# Patient Record
Sex: Female | Born: 1975 | Race: White | Hispanic: No | Smoking: Never smoker
Health system: Southern US, Community
[De-identification: ages and names within clinical notes are randomized; demographics above are authoritative.]

## PROBLEM LIST (undated history)

## (undated) DIAGNOSIS — I10 Essential (primary) hypertension: Secondary | ICD-10-CM

## (undated) DIAGNOSIS — I739 Peripheral vascular disease, unspecified: Secondary | ICD-10-CM

## (undated) HISTORY — DX: Peripheral vascular disease, unspecified: I73.9

---

## 1999-04-05 HISTORY — PX: WISDOM TOOTH EXTRACTION: SHX21

## 2000-01-19 HISTORY — PX: OTHER SURGICAL HISTORY: SHX169

## 2005-04-04 HISTORY — PX: LASER ABLATION: SHX1947

## 2005-09-29 DIAGNOSIS — N946 Dysmenorrhea, unspecified: Secondary | ICD-10-CM

## 2005-09-29 HISTORY — DX: Dysmenorrhea, unspecified: N94.6

## 2007-09-03 DIAGNOSIS — R5383 Other fatigue: Secondary | ICD-10-CM

## 2007-09-03 HISTORY — DX: Other fatigue: R53.83

## 2011-09-12 DIAGNOSIS — F32A Depression, unspecified: Secondary | ICD-10-CM | POA: Insufficient documentation

## 2011-09-12 DIAGNOSIS — M25562 Pain in left knee: Secondary | ICD-10-CM | POA: Insufficient documentation

## 2013-07-01 DIAGNOSIS — I82409 Acute embolism and thrombosis of unspecified deep veins of unspecified lower extremity: Secondary | ICD-10-CM

## 2013-07-01 HISTORY — DX: Acute embolism and thrombosis of unspecified deep veins of unspecified lower extremity: I82.409

## 2013-07-05 DIAGNOSIS — Z7901 Long term (current) use of anticoagulants: Secondary | ICD-10-CM

## 2013-07-05 HISTORY — DX: Long term (current) use of anticoagulants: Z79.01

## 2016-03-02 ENCOUNTER — Emergency Department (HOSPITAL_COMMUNITY)
Admission: EM | Admit: 2016-03-02 | Discharge: 2016-03-02 | Disposition: A | Discharge status: Home / Self Care | Payer: Medicaid - Out of State | Attending: Emergency Medicine | Admitting: Emergency Medicine

## 2016-03-02 ENCOUNTER — Encounter (HOSPITAL_COMMUNITY): Payer: Self-pay

## 2016-03-02 DIAGNOSIS — N390 Urinary tract infection, site not specified: Secondary | ICD-10-CM | POA: Diagnosis not present

## 2016-03-02 DIAGNOSIS — I10 Essential (primary) hypertension: Secondary | ICD-10-CM | POA: Diagnosis not present

## 2016-03-02 DIAGNOSIS — R102 Pelvic and perineal pain: Secondary | ICD-10-CM | POA: Diagnosis present

## 2016-03-02 HISTORY — DX: Essential (primary) hypertension: I10

## 2016-03-02 LAB — URINALYSIS, ROUTINE W REFLEX MICROSCOPIC
Bilirubin Urine: NEGATIVE
Glucose, UA: NEGATIVE mg/dL
Ketones, ur: NEGATIVE mg/dL
Nitrite: POSITIVE — AB
Protein, ur: 100 mg/dL — AB
Specific Gravity, Urine: 1.016 (ref 1.005–1.030)
pH: 5.5 (ref 5.0–8.0)

## 2016-03-02 LAB — URINE MICROSCOPIC-ADD ON

## 2016-03-02 LAB — POC URINE PREG, ED: PREG TEST UR: NEGATIVE

## 2016-03-02 MED ORDER — CEPHALEXIN 500 MG PO CAPS: 500.0000 mg | ORAL_CAPSULE | Freq: Two times a day (BID) | ORAL | 0 refills | Status: DC

## 2016-03-02 MED ORDER — PHENAZOPYRIDINE HCL 200 MG PO TABS: 200.0000 mg | ORAL_TABLET | Freq: Three times a day (TID) | ORAL | 0 refills | Status: DC

## 2016-03-02 MED ORDER — PHENAZOPYRIDINE HCL 100 MG PO TABS
95.0000 mg | ORAL_TABLET | Freq: Once | ORAL | Status: AC
  Administered 2016-03-02: 100 mg via ORAL
  Filled 2016-03-02: qty 1

## 2016-03-02 MED ORDER — NAPROXEN 500 MG PO TABS: 500.0000 mg | ORAL_TABLET | Freq: Two times a day (BID) | ORAL | 0 refills | Status: DC

## 2016-03-02 MED ORDER — NAPROXEN 250 MG PO TABS
500.0000 mg | ORAL_TABLET | Freq: Once | ORAL | Status: AC
  Administered 2016-03-02: 500 mg via ORAL
  Filled 2016-03-02: qty 2

## 2016-03-02 MED ORDER — CEPHALEXIN 250 MG PO CAPS
500.0000 mg | ORAL_CAPSULE | Freq: Once | ORAL | Status: AC
  Administered 2016-03-02: 500 mg via ORAL
  Filled 2016-03-02: qty 2

## 2016-03-02 NOTE — ED Notes (Signed)
Spoke with family member regarding waiting time. Test resulted from triage spoke with [facility] to possibly be seen by provider in that location.

## 2016-03-02 NOTE — ED Notes (Signed)
Currently waiting for a discharge from [facility]. Patient notified and will wait at this time.

## 2016-03-02 NOTE — ED Notes (Signed)
Pt stable, ambulatory, states understanding of discharge instructions 

## 2016-03-02 NOTE — ED Provider Notes (Signed)
[facility]-EMERGENCY DEPT Provider Note   CSN: [id number] Arrival date & time: 03/02/16  1519    History   Chief Complaint Chief Complaint  Patient presents with  . Pelvic Pain    HPI Emily Roy is a 40 y.o. female.  40 year old female presents to the emergency department for 3 days of pelvic pain. Symptoms have been worsening. She describes a sharp sensation which she feels is located at her uterus and radiating through her cervix. She does report urinary urgency and frequency as well as a sensation of incomplete bladder emptying. She has had some burning dysuria at the end of each void. Patient started her menstrual cycle today. She has had no fever, nausea, or vomiting. No medications taken prior to arrival for symptoms. She has not been sexually active in 9 years. No concern for STDs. No history of abdominal surgeries.   The history is provided by the patient. No language interpreter was used.    Past Medical History:  Diagnosis Date  . Hypertension     There are no active problems to display for this patient.   History reviewed. No pertinent surgical history.  OB History    No data available       Home Medications    Prior to Admission medications   Not on File    Family History No family history on file.  Social History Social History  Substance Use Topics  . Smoking status: Never Smoker  . Smokeless tobacco: Never Used  . Alcohol use No     Allergies   Patient has no known allergies.   Review of Systems Review of Systems Ten systems reviewed and are negative for acute change, except as noted in the HPI.    Physical Exam Updated Vital Signs BP 168/89 (BP Location: Right Arm)   Pulse 106   Temp 98.2 F (36.8 C) (Oral)   Resp 20   SpO2 100%   Physical Exam  Constitutional: She is oriented to person, place, and time. She appears well-developed and well-nourished. No distress.  Nontoxic appearing and in no distress  HENT:  Head:  Normocephalic and atraumatic.  Eyes: Conjunctivae and EOM are normal. No scleral icterus.  Neck: Normal range of motion.  Pulmonary/Chest: Effort normal. No respiratory distress. She has no wheezes.  Respirations even and unlabored  Abdominal: Soft. She exhibits no distension. There is no tenderness. There is no guarding.  Soft, obese, nontender abdomen  Musculoskeletal: Normal range of motion.  Neurological: She is alert and oriented to person, place, and time. She exhibits normal muscle tone. Coordination normal.  Skin: Skin is warm and dry. No rash noted. She is not diaphoretic. No erythema. No pallor.  Psychiatric: She has a normal mood and affect. Her behavior is normal.  Nursing note and vitals reviewed.    ED Treatments / Results  Labs (all labs ordered are listed, but only abnormal results are displayed) Labs Reviewed  URINALYSIS, ROUTINE W REFLEX MICROSCOPIC (NOT AT [facility]) - Abnormal; Notable for the following:       Result Value   Color, Urine AMBER (*)    APPearance TURBID (*)    Hgb urine dipstick LARGE (*)    Protein, ur 100 (*)    Nitrite POSITIVE (*)    Leukocytes, UA LARGE (*)    All other components within normal limits  URINE MICROSCOPIC-ADD ON - Abnormal; Notable for the following:    Squamous Epithelial / LPF 0-5 (*)    Bacteria, UA MANY (*)  Casts HYALINE CASTS (*)    All other components within normal limits  URINE CULTURE  POC URINE PREG, ED    EKG  EKG Interpretation None       Radiology No results found.  Procedures Procedures (including critical care time)  Medications Ordered in ED Medications  phenazopyridine (PYRIDIUM) tablet 100 mg (not administered)  naproxen (NAPROSYN) tablet 500 mg (not administered)  cephALEXin (KEFLEX) capsule 500 mg (not administered)     Initial Impression / Assessment and Plan / ED Course  I have reviewed the triage vital signs and the nursing notes.  Pertinent labs & imaging results that were  available during my care of the patient were reviewed by me and considered in my medical decision making (see chart for details).  Clinical Course     Pt has been diagnosed with a UTI. Pt is afebrile, normotensive, and denies N/V. No abdominal TTP. Patient to be discharged home with antibiotics and instructions to follow up with PCP if symptoms persist. Return precautions discussed and provided. Patient discharged in stable condition with no unaddressed concerns.   Final Clinical Impressions(s) / ED Diagnoses   Final diagnoses:  Acute lower UTI    New Prescriptions New Prescriptions   CEPHALEXIN (KEFLEX) 500 MG CAPSULE    Take 1 capsule (500 mg total) by mouth 2 (two) times daily.   NAPROXEN (NAPROSYN) 500 MG TABLET    Take 1 tablet (500 mg total) by mouth 2 (two) times daily.   PHENAZOPYRIDINE (PYRIDIUM) 200 MG TABLET    Take 1 tablet (200 mg total) by mouth 3 (three) times daily.     Antony MaduraKelly Frederico Gerling, PA-C 03/02/16 2221    Margarita Grizzleanielle Ray, MD 03/07/16 [phone]

## 2016-03-02 NOTE — ED Notes (Addendum)
Patient stated wanting to leave for a second time. Explain talking with [facility] to be possibly seen in that location. States will wait to hear if she will be able to be seen.

## 2016-03-02 NOTE — Discharge Instructions (Signed)
Take Keflex as prescribed until finished. Take pyridium to improve bladder spasms and pain. Take Naproxen for persistent pain. Follow up with a primary care doctor.

## 2016-03-02 NOTE — ED Triage Notes (Signed)
Patient complains of pelvic pain and possible hematuria or vaginal bleeding for several days. States that she is 6 days late on period

## 2016-03-05 LAB — URINE CULTURE: Culture: >=100000 — AB

## 2016-03-06 ENCOUNTER — Telehealth: Payer: Self-pay

## 2016-03-06 NOTE — Telephone Encounter (Signed)
Post ED Visit - Positive Culture Follow-up  Culture report reviewed by antimicrobial stewardship pharmacist:  []  Enzo BiNathan Batchelder, Pharm.D. []  Celedonio MiyamotoJeremy Frens, Pharm.D., BCPS []  Garvin FilaMike Maccia, Pharm.D. []  Georgina PillionElizabeth Martin, Pharm.D., BCPS []  [place] CityMinh Pham, [address]Ph[address]D., BCPS, AAHIVP []  Estella HuskMichelle Turner, Pharm.D., BCPS, AAHIVP []  Cassie Stewart, Pharm.D. []  Sherle Poeob Vincent, [address]Ph[address] Rachel Rumbarger Pharm D Positive urine culture Treated with Cephalexin, organism sensitive to the same and no further patient follow-up is required at this time.  Jerry CarasCullom, Ensley Blas Burnett 03/06/2016, 8:58 AM

## 2017-03-16 ENCOUNTER — Ambulatory Visit: Payer: Medicaid - Out of State | Admitting: Family Medicine

## 2020-04-09 ENCOUNTER — Other Ambulatory Visit: Payer: No Typology Code available for payment source

## 2020-04-09 DIAGNOSIS — Z20822 Contact with and (suspected) exposure to covid-19: Secondary | ICD-10-CM

## 2020-04-09 NOTE — Addendum Note (Signed)
Addended by: Royston Sinner B on: 04/09/2020 09:54 AM   Modules accepted: Orders

## 2020-04-10 LAB — NOVEL CORONAVIRUS, NAA: SARS-CoV-2, NAA: NOT DETECTED

## 2020-04-10 LAB — SARS-COV-2, NAA 2 DAY TAT

## 2020-08-05 ENCOUNTER — Emergency Department (HOSPITAL_BASED_OUTPATIENT_CLINIC_OR_DEPARTMENT_OTHER): Payer: No Typology Code available for payment source

## 2020-08-05 ENCOUNTER — Encounter (HOSPITAL_BASED_OUTPATIENT_CLINIC_OR_DEPARTMENT_OTHER): Payer: Self-pay | Admitting: Emergency Medicine

## 2020-08-05 ENCOUNTER — Other Ambulatory Visit: Payer: Self-pay

## 2020-08-05 ENCOUNTER — Emergency Department (HOSPITAL_BASED_OUTPATIENT_CLINIC_OR_DEPARTMENT_OTHER)
Admission: EM | Admit: 2020-08-05 | Discharge: 2020-08-05 | Disposition: A | Discharge status: Home / Self Care | Payer: No Typology Code available for payment source | Attending: Emergency Medicine | Admitting: Emergency Medicine

## 2020-08-05 DIAGNOSIS — J40 Bronchitis, not specified as acute or chronic: Secondary | ICD-10-CM | POA: Diagnosis not present

## 2020-08-05 DIAGNOSIS — I10 Essential (primary) hypertension: Secondary | ICD-10-CM | POA: Insufficient documentation

## 2020-08-05 DIAGNOSIS — R059 Cough, unspecified: Secondary | ICD-10-CM | POA: Diagnosis present

## 2020-08-05 DIAGNOSIS — Z20822 Contact with and (suspected) exposure to covid-19: Secondary | ICD-10-CM | POA: Diagnosis not present

## 2020-08-05 LAB — RESP PANEL BY RT-PCR (FLU A&B, COVID) ARPGX2
Influenza A by PCR: NEGATIVE
Influenza B by PCR: NEGATIVE
SARS Coronavirus 2 by RT PCR: NEGATIVE

## 2020-08-05 MED ORDER — ALBUTEROL SULFATE HFA 108 (90 BASE) MCG/ACT IN AERS
2.0000 | INHALATION_SPRAY | Freq: Once | RESPIRATORY_TRACT | Status: AC
  Administered 2020-08-05: 2 via RESPIRATORY_TRACT
  Filled 2020-08-05: qty 6.7

## 2020-08-05 MED ORDER — AEROCHAMBER PLUS FLO-VU MEDIUM MISC
1.0000 | Freq: Once | Status: AC
  Administered 2020-08-05: 1

## 2020-08-05 MED ORDER — IPRATROPIUM-ALBUTEROL 0.5-2.5 (3) MG/3ML IN SOLN: 3.0000 mL | Freq: Once | RESPIRATORY_TRACT | Status: AC

## 2020-08-05 MED ORDER — AZITHROMYCIN 250 MG PO TABS: ORAL_TABLET | ORAL | 0 refills | Status: DC

## 2020-08-05 MED ORDER — PREDNISONE 20 MG PO TABS: ORAL_TABLET | ORAL | 0 refills | Status: DC

## 2020-08-05 MED ORDER — BENZONATATE 100 MG PO CAPS: 200.0000 mg | ORAL_CAPSULE | Freq: Three times a day (TID) | ORAL | 0 refills | Status: DC | PRN

## 2020-08-05 MED ORDER — IPRATROPIUM-ALBUTEROL 0.5-2.5 (3) MG/3ML IN SOLN
RESPIRATORY_TRACT | Status: AC
  Administered 2020-08-05: 3 mL via RESPIRATORY_TRACT
  Filled 2020-08-05: qty 3

## 2020-08-05 NOTE — ED Provider Notes (Signed)
[facility] EMERGENCY DEPT Provider Note   CSN: [id number] Arrival date & time: 08/05/20  1906     History Chief Complaint  Patient presents with  . Cough  . Shortness of Breath    Emily Roy is a 45 y.o. female.  HPI Patient reports has been sick for 4 days.  She reports she is had a harsh cough.  Her chest has felt tight and she is felt short of breath.  Cough has been productive of a brown tinge sputum. she also reports she has had fever intermittently fever is as high as 101.  Some nausea and gagging but no vomiting.  No abdominal pain.  No lower extremity swelling or calf pain.  Symptoms have had some associated nasal congestion and postnasal drainage.  No significant sore throat or generalized body aches.  Patient thought she may have had COVID exposure.  Patient denies history of asthma.  Patient does not smoke.    Past Medical History:  Diagnosis Date  . Hypertension     There are no problems to display for this patient.   History reviewed. No pertinent surgical history.   OB History   No obstetric history on file.     History reviewed. No pertinent family history.  Social History   Tobacco Use  . Smoking status: Never Smoker  . Smokeless tobacco: Never Used  Substance Use Topics  . Alcohol use: No    Home Medications Prior to Admission medications   Medication Sig Start Date End Date Taking? Authorizing Provider  azithromycin (ZITHROMAX Z-PAK) 250 MG tablet 2 Tablets on the first day.  Then 1 tablet daily for the next 4 days. 08/05/20  Yes Arby Barrette, MD  benzonatate (TESSALON PERLES) 100 MG capsule Take 2 capsules (200 mg total) by mouth 3 (three) times daily as needed for cough. Swallow capsule whole.  Do not chew or suck the capsule 08/05/20  Yes Mea Ozga, Lebron Conners, MD  predniSONE (DELTASONE) 20 MG tablet 2 tabs po daily x 3 days 08/05/20  Yes Marlan Steward, Lebron Conners, MD  cephALEXin (KEFLEX) 500 MG capsule Take 1 capsule (500 mg total) by  mouth 2 (two) times daily. 03/02/16   Antony Madura, PA-C  naproxen (NAPROSYN) 500 MG tablet Take 1 tablet (500 mg total) by mouth 2 (two) times daily. 03/02/16   Antony Madura, PA-C  phenazopyridine (PYRIDIUM) 200 MG tablet Take 1 tablet (200 mg total) by mouth 3 (three) times daily. 03/02/16   Antony Madura, PA-C    Allergies    Patient has no known allergies.  Review of Systems   Review of Systems 10 systems reviewed and negative except as per HPI Physical Exam Updated Vital Signs BP 135/76   Pulse 93   Temp 98.8 F (37.1 C) (Oral)   Resp (!) 22   Ht 5\' 8"  (1.727 m)   Wt 136.1 kg   LMP 07/28/2020   SpO2 100%   BMI 45.61 kg/m   Physical Exam Constitutional:      Comments: Alert and nontoxic.  Mild increased work of breathing.  HENT:     Mouth/Throat:     Mouth: Mucous membranes are moist.     Pharynx: Oropharynx is clear.     Comments: No erythema or exudate in the throat Eyes:     Extraocular Movements: Extraocular movements intact.     Conjunctiva/sclera: Conjunctivae normal.  Cardiovascular:     Rate and Rhythm: Normal rate and regular rhythm.  Pulmonary:     Comments: Patient has  wheeze diffusely throughout lung fields.  No gross rhonchi or crackle Abdominal:     General: There is no distension.     Palpations: Abdomen is soft.     Tenderness: There is no abdominal tenderness. There is no guarding.  Musculoskeletal:        General: No swelling or tenderness. Normal range of motion.     Right lower leg: No edema.     Left lower leg: No edema.  Skin:    General: Skin is warm and dry.  Neurological:     General: No focal deficit present.     Mental Status: She is oriented to person, place, and time.     Motor: No weakness.     Coordination: Coordination normal.  Psychiatric:        Mood and Affect: Mood normal.     ED Results / Procedures / Treatments   Labs (all labs ordered are listed, but only abnormal results are displayed) Labs Reviewed  RESP PANEL  BY RT-PCR (FLU A&B, COVID) [id number]    EKG None  Radiology DG Chest Port 1 View  Result Date: 08/05/2020 CLINICAL DATA:  Cough and fever EXAM: PORTABLE CHEST 1 VIEW COMPARISON:  None. FINDINGS: The heart size and mediastinal contours are within normal limits. Both lungs are clear. The visualized skeletal structures are unremarkable. IMPRESSION: No active disease. Electronically Signed   By: Jasmine Pang M.D.   On: 08/05/2020 20:15    Procedures Procedures   Medications Ordered in ED Medications  ipratropium-albuterol (DUONEB) 0.5-2.5 (3) MG/3ML nebulizer solution 3 mL (3 mLs Nebulization Given 08/05/20 1935)  albuterol (VENTOLIN HFA) 108 (90 Base) MCG/ACT inhaler 2 puff (2 puffs Inhalation Given 08/05/20 2116)  AeroChamber Plus Flo-Vu Medium MISC 1 each (1 each Other Given 08/05/20 2115)    ED Course  I have reviewed the triage vital signs and the nursing notes.  Pertinent labs & imaging results that were available during my care of the patient were reviewed by me and considered in my medical decision making (see chart for details).    MDM Rules/Calculators/A&P                          Patient presents with a cough that is productive of a brown-tinged sputum and fever up to 101.  Chest x-ray does not show any acute infiltrates.  COVID and influenza testing are negative.  With patient having 4 days of symptoms including cough with sputum and fever, will opt to treat for possible atypical pneumonia or complex case of bronchitis.  Patient does have wheezing on exam.  She denies history of asthma and is a non-smoker.  Will prescribe Z-Pak and 3 days of prednisone with an albuterol inhaler and Tessalon Perles.  Follow-up plan reviewed and a return precautions reviewed. Final Clinical Impression(s) / ED Diagnoses Final diagnoses:  Bronchitis    Rx / DC Orders ED Discharge Orders         Ordered    azithromycin (ZITHROMAX Z-PAK) 250 MG tablet        08/05/20 2108    benzonatate (TESSALON  PERLES) 100 MG capsule  3 times daily PRN        08/05/20 2108    predniSONE (DELTASONE) 20 MG tablet        08/05/20 2108           Arby Barrette, MD 08/05/20 2142

## 2020-08-05 NOTE — Discharge Instructions (Signed)
1.  Schedule follow-up appointment with a family doctor.  If you do not have a family doctor, use referral number your discharge instructions to find one. 2.  Start your Z-Pak and prednisone as prescribed.  Use the albuterol inhaler every 4 hours as needed for coughing and wheezing.  You may take a Tessalon Perle for coughing.  Swallow this whole, do not chew it or suck on it. 3.  Return to the emergency department if you develop worsening shortness of breath, chest pain or other concerning symptoms. 4.  Your COVID testing was negative and your chest x-ray did not show any area of pneumonia at this time.

## 2020-08-05 NOTE — ED Triage Notes (Signed)
  Patient comes in with cough and SOB that has been going on for a few days.  Patient states she has had a fever, nasal congestion, headache, and sore throat for 3 days. Tmax of 101.1 at home.  Sick contact at work, possible COVID exposure.   Has been taking tylenol and ibuprofen, with 800 mg of ibuprofen at 1400.  Pain 6/10, fullness in chest with inspiration.

## 2020-08-05 NOTE — ED Notes (Signed)
Pt educated on proper use of Aero chamber for MDI administration. Pt able to teach back to RT proper use and technique.

## 2021-09-21 DIAGNOSIS — R0602 Shortness of breath: Secondary | ICD-10-CM | POA: Diagnosis not present

## 2021-09-21 DIAGNOSIS — M7989 Other specified soft tissue disorders: Secondary | ICD-10-CM | POA: Diagnosis not present

## 2021-09-21 DIAGNOSIS — R600 Localized edema: Secondary | ICD-10-CM | POA: Diagnosis not present

## 2021-09-22 ENCOUNTER — Emergency Department (HOSPITAL_BASED_OUTPATIENT_CLINIC_OR_DEPARTMENT_OTHER)
Admission: EM | Admit: 2021-09-22 | Discharge: 2021-09-22 | Disposition: A | Discharge status: Home / Self Care | Payer: BC Managed Care – PPO | Attending: Emergency Medicine | Admitting: Emergency Medicine

## 2021-09-22 ENCOUNTER — Emergency Department (HOSPITAL_BASED_OUTPATIENT_CLINIC_OR_DEPARTMENT_OTHER): Payer: BC Managed Care – PPO | Admitting: Radiology

## 2021-09-22 ENCOUNTER — Other Ambulatory Visit: Payer: Self-pay

## 2021-09-22 ENCOUNTER — Other Ambulatory Visit (HOSPITAL_BASED_OUTPATIENT_CLINIC_OR_DEPARTMENT_OTHER): Payer: Self-pay

## 2021-09-22 DIAGNOSIS — R030 Elevated blood-pressure reading, without diagnosis of hypertension: Secondary | ICD-10-CM | POA: Diagnosis not present

## 2021-09-22 DIAGNOSIS — R600 Localized edema: Secondary | ICD-10-CM | POA: Diagnosis not present

## 2021-09-22 DIAGNOSIS — R140 Abdominal distension (gaseous): Secondary | ICD-10-CM | POA: Insufficient documentation

## 2021-09-22 DIAGNOSIS — I10 Essential (primary) hypertension: Secondary | ICD-10-CM | POA: Diagnosis not present

## 2021-09-22 DIAGNOSIS — R0602 Shortness of breath: Secondary | ICD-10-CM | POA: Diagnosis not present

## 2021-09-22 LAB — D-DIMER, QUANTITATIVE: D-Dimer, Quant: 0.3 ug/mL-FEU (ref 0.00–0.50)

## 2021-09-22 LAB — CBC WITH DIFFERENTIAL/PLATELET
Abs Immature Granulocytes: 0.03 10*3/uL (ref 0.00–0.07)
Basophils Absolute: 0 10*3/uL (ref 0.0–0.1)
Basophils Relative: 1 %
Eosinophils Absolute: 0.2 10*3/uL (ref 0.0–0.5)
Eosinophils Relative: 3 %
HCT: 39.4 % (ref 36.0–46.0)
Hemoglobin: 12.4 g/dL (ref 12.0–15.0)
Immature Granulocytes: 0 %
Lymphocytes Relative: 26 %
Lymphs Abs: 1.8 10*3/uL (ref 0.7–4.0)
MCH: 25.3 pg — ABNORMAL LOW (ref 26.0–34.0)
MCHC: 31.5 g/dL (ref 30.0–36.0)
MCV: 80.4 fL (ref 80.0–100.0)
Monocytes Absolute: 0.4 10*3/uL (ref 0.1–1.0)
Monocytes Relative: 6 %
Neutro Abs: 4.5 10*3/uL (ref 1.7–7.7)
Neutrophils Relative %: 64 %
Platelets: 310 10*3/uL (ref 150–400)
RBC: 4.9 MIL/uL (ref 3.87–5.11)
RDW: 15.9 % — ABNORMAL HIGH (ref 11.5–15.5)
WBC: 7 10*3/uL (ref 4.0–10.5)
nRBC: 0 % (ref 0.0–0.2)

## 2021-09-22 LAB — BASIC METABOLIC PANEL
Anion gap: 11 (ref 5–15)
BUN: 10 mg/dL (ref 6–20)
CO2: 23 mmol/L (ref 22–32)
Calcium: 9.2 mg/dL (ref 8.9–10.3)
Chloride: 104 mmol/L (ref 98–111)
Creatinine, Ser: 0.77 mg/dL (ref 0.44–1.00)
GFR, Estimated: >60 mL/min (ref >60)
Glucose, Bld: 116 mg/dL — ABNORMAL HIGH (ref 70–99)
Potassium: 4.2 mmol/L (ref 3.5–5.1)
Sodium: 138 mmol/L (ref 135–145)

## 2021-09-22 LAB — TROPONIN I (HIGH SENSITIVITY): Troponin I (High Sensitivity): <2 ng/L (ref <18)

## 2021-09-22 MED ORDER — FUROSEMIDE 10 MG/ML IJ SOLN
40.0000 mg | Freq: Once | INTRAMUSCULAR | Status: AC
  Administered 2021-09-22: 40 mg via INTRAVENOUS
  Filled 2021-09-22: qty 4

## 2021-09-22 MED ORDER — FUROSEMIDE 20 MG PO TABS
20.0000 mg | ORAL_TABLET | Freq: Every day | ORAL | 0 refills | Status: DC
Start: 2021-09-22 — End: 2021-10-14
  Filled 2021-09-22: qty 10, 10d supply, fill #0

## 2021-09-22 NOTE — Discharge Instructions (Signed)
Please take furosemide daily for 10 days.  If for any reason you start to feel lightheaded, dizziness, or fatigue please stop taking medication.  There is always the chance that you lose the water weight and become dehydrated.  Please follow-up with your primary care physician or the wellness clinic provided for elevated blood pressure reading and further management of hypertension.  You will likely require blood pressure medications however we do not like to start medications unless you have to blood pressure readings that are elevated at separate times.

## 2021-09-22 NOTE — ED Triage Notes (Signed)
She reports recent "swelling everywhere" plus shortness of breath without chest pain. She was seen at an urgent care yesterday, where they performed cxr and ekg and she was told by them that she has "fluid around my heart". She Is ambulatory and in no distress. She was met by, and assessed by our R.T., Saralyn Pilar.

## 2021-09-22 NOTE — ED Notes (Signed)
RT Note: Pt. brought back from lobby after being notified here for s.o.b./roomed to #11, initially assessed b.s. which were clear/=b/l, RT to follow.

## 2021-09-22 NOTE — ED Provider Notes (Incomplete)
[facility] EMERGENCY DEPT Provider Note   CSN: [id number] Arrival date & time: 09/22/21  0630     History {Add pertinent medical, surgical, social history, OB history to HPI:1} Chief Complaint  Patient presents with   Shortness of Breath    Emily Roy is a 46 y.o. female.  Patient is a 46 year old female with no significant past medical history presenting for complaints of shortness of breath.  Patient admits to shortness of breath that is worse with exertion.  Occurs at rest.  Admits to wheezing.  Denies any fevers chills or coughing.  Denies any history of COPD, smoking, asthma.  Patient also admits to bilateral lower extremity swelling, upper extremity swelling, and abdominal bloating.  Dates she used to take furosemide for blood pressure and water retention however was told to stop several years ago due to normal blood pressures.  Current blood pressure 150/107.  Patient midst to history of headaches, blurred vision, and chest tightness intermittently.  None today.  The history is provided by the patient. No language interpreter was used.  Shortness of Breath Associated symptoms: no abdominal pain, no chest pain, no cough, no ear pain, no fever, no rash, no sore throat and no vomiting        Home Medications Prior to Admission medications   Medication Sig Start Date End Date Taking? Authorizing Provider  azithromycin (ZITHROMAX Z-PAK) 250 MG tablet 2 Tablets on the first day.  Then 1 tablet daily for the next 4 days. 08/05/20   Arby Barrette, MD  benzonatate (TESSALON PERLES) 100 MG capsule Take 2 capsules (200 mg total) by mouth 3 (three) times daily as needed for cough. Swallow capsule whole.  Do not chew or suck the capsule 08/05/20   Arby Barrette, MD  cephALEXin (KEFLEX) 500 MG capsule Take 1 capsule (500 mg total) by mouth 2 (two) times daily. 03/02/16   Antony Madura, PA-C  naproxen (NAPROSYN) 500 MG tablet Take 1 tablet (500 mg total) by mouth 2  (two) times daily. 03/02/16   Antony Madura, PA-C  phenazopyridine (PYRIDIUM) 200 MG tablet Take 1 tablet (200 mg total) by mouth 3 (three) times daily. 03/02/16   Antony Madura, PA-C  predniSONE (DELTASONE) 20 MG tablet 2 tabs po daily x 3 days 08/05/20   Arby Barrette, MD      Allergies    Patient has no known allergies.    Review of Systems   Review of Systems  Constitutional:  Negative for chills and fever.  HENT:  Negative for ear pain and sore throat.   Eyes:  Negative for pain and visual disturbance.  Respiratory:  Positive for shortness of breath. Negative for cough.   Cardiovascular:  Positive for leg swelling. Negative for chest pain and palpitations.  Gastrointestinal:  Negative for abdominal pain and vomiting.  Genitourinary:  Negative for dysuria and hematuria.  Musculoskeletal:  Negative for arthralgias and back pain.  Skin:  Negative for color change and rash.  Neurological:  Negative for seizures and syncope.  All other systems reviewed and are negative.   Physical Exam Updated Vital Signs BP (!) 150/101   Pulse 93   Temp 98.2 F (36.8 C) (Oral)   Resp 18   SpO2 94%  Physical Exam Vitals and nursing note reviewed.  Constitutional:      General: She is not in acute distress.    Appearance: She is well-developed.  HENT:     Head: Normocephalic and atraumatic.  Eyes:     Conjunctiva/sclera: Conjunctivae  normal.  Cardiovascular:     Rate and Rhythm: Normal rate and regular rhythm.     Heart sounds: No murmur heard. Pulmonary:     Effort: Pulmonary effort is normal. No respiratory distress.     Breath sounds: Normal breath sounds.  Abdominal:     Palpations: Abdomen is soft.     Tenderness: There is no abdominal tenderness.  Musculoskeletal:        General: No swelling.     Cervical back: Neck supple.     Right lower leg: 3+ Edema present.     Left lower leg: 3+ Edema present.  Skin:    General: Skin is warm and dry.     Capillary Refill: Capillary  refill takes less than 2 seconds.  Neurological:     Mental Status: She is alert.  Psychiatric:        Mood and Affect: Mood normal.     ED Results / Procedures / Treatments   Labs (all labs ordered are listed, but only abnormal results are displayed) Labs Reviewed  CBC WITH DIFFERENTIAL/PLATELET - Abnormal; Notable for the following components:      Result Value   MCH 25.3 (*)    RDW 15.9 (*)    All other components within normal limits  BASIC METABOLIC PANEL - Abnormal; Notable for the following components:   Glucose, Bld 116 (*)    All other components within normal limits  D-DIMER, QUANTITATIVE  TROPONIN I (HIGH SENSITIVITY)    EKG None  Radiology No results found.  Procedures Procedures  {Document cardiac monitor, telemetry assessment procedure when appropriate:1}  Medications Ordered in ED Medications  furosemide (LASIX) injection 40 mg (40 mg Intravenous Given 09/22/21 0843)    ED Course/ Medical Decision Making/ A&P                           Medical Decision Making Amount and/or Complexity of Data Reviewed Labs: ordered.  Risk Prescription drug management.   16:41 AM 46 year old female with no significant past medical history presenting for complaints of shortness of breath.  Patient is alert and oriented x3, no acute distress, afebrile, stable vital signs.  Physical exam demonstrates equal bilateral breath sounds with no adventitious lung sounds.  No wheezing.  Patient has swelling of bilateral lower extremities.  No pitting edema.  Blood pressure currently 150/101 while patient is resting.  Water retention visualized in upper and lower extremities.  Lasix 40 mg IV given.  ECG stable with no ST segment elevation or depression.  Normal sinus rhythm with a rate of 90 bpm.  No T wave inversions.  Normal axis.  Stable intervals.  Lites, troponin, chest x-ray within normal limits.   No signs of myocardial infarction.  No pneumothorax.  No pneumonia.  D-dimer  negative.  Low suspicion pulmonary embolism.  {Document critical care time when appropriate:1} {Document review of labs and clinical decision tools ie heart score, Chads2Vasc2 etc:1}  {Document your independent review of radiology images, and any outside records:1} {Document your discussion with family members, caretakers, and with consultants:1} {Document social determinants of health affecting pt's care:1} {Document your decision making why or why not admission, treatments were needed:1} Final Clinical Impression(s) / ED Diagnoses Final diagnoses:  None    Rx / DC Orders ED Discharge Orders     None

## 2021-09-23 ENCOUNTER — Other Ambulatory Visit: Payer: Self-pay

## 2021-09-23 ENCOUNTER — Emergency Department (HOSPITAL_COMMUNITY): Payer: BC Managed Care – PPO

## 2021-09-23 ENCOUNTER — Emergency Department (HOSPITAL_COMMUNITY)
Admission: EM | Admit: 2021-09-23 | Discharge: 2021-09-23 | Disposition: A | Discharge status: Home / Self Care | Payer: BC Managed Care – PPO | Attending: Emergency Medicine | Admitting: Emergency Medicine

## 2021-09-23 ENCOUNTER — Encounter (HOSPITAL_COMMUNITY): Payer: Self-pay | Admitting: *Deleted

## 2021-09-23 DIAGNOSIS — K8020 Calculus of gallbladder without cholecystitis without obstruction: Secondary | ICD-10-CM | POA: Diagnosis not present

## 2021-09-23 DIAGNOSIS — R0789 Other chest pain: Secondary | ICD-10-CM | POA: Diagnosis not present

## 2021-09-23 DIAGNOSIS — R531 Weakness: Secondary | ICD-10-CM | POA: Diagnosis not present

## 2021-09-23 DIAGNOSIS — Z79899 Other long term (current) drug therapy: Secondary | ICD-10-CM | POA: Insufficient documentation

## 2021-09-23 DIAGNOSIS — I10 Essential (primary) hypertension: Secondary | ICD-10-CM | POA: Diagnosis not present

## 2021-09-23 DIAGNOSIS — R519 Headache, unspecified: Secondary | ICD-10-CM | POA: Diagnosis not present

## 2021-09-23 DIAGNOSIS — R42 Dizziness and giddiness: Secondary | ICD-10-CM | POA: Diagnosis not present

## 2021-09-23 DIAGNOSIS — R002 Palpitations: Secondary | ICD-10-CM | POA: Diagnosis not present

## 2021-09-23 LAB — CBC
HCT: 39.9 % (ref 36.0–46.0)
Hemoglobin: 12.8 g/dL (ref 12.0–15.0)
MCH: 26 pg (ref 26.0–34.0)
MCHC: 32.1 g/dL (ref 30.0–36.0)
MCV: 81.1 fL (ref 80.0–100.0)
Platelets: 318 10*3/uL (ref 150–400)
RBC: 4.92 MIL/uL (ref 3.87–5.11)
RDW: 16.1 % — ABNORMAL HIGH (ref 11.5–15.5)
WBC: 9.6 10*3/uL (ref 4.0–10.5)
nRBC: 0 % (ref 0.0–0.2)

## 2021-09-23 LAB — BASIC METABOLIC PANEL
Anion gap: 8 (ref 5–15)
BUN: 17 mg/dL (ref 6–20)
CO2: 23 mmol/L (ref 22–32)
Calcium: 9.2 mg/dL (ref 8.9–10.3)
Chloride: 104 mmol/L (ref 98–111)
Creatinine, Ser: 0.76 mg/dL (ref 0.44–1.00)
GFR, Estimated: >60 mL/min (ref >60)
Glucose, Bld: 90 mg/dL (ref 70–99)
Potassium: 3.9 mmol/L (ref 3.5–5.1)
Sodium: 135 mmol/L (ref 135–145)

## 2021-09-23 LAB — TROPONIN I (HIGH SENSITIVITY)
Troponin I (High Sensitivity): <2 ng/L (ref <18)
Troponin I (High Sensitivity): <2 ng/L (ref <18)

## 2021-09-23 MED ORDER — IOHEXOL 350 MG/ML SOLN
100.0000 mL | Freq: Once | INTRAVENOUS | Status: AC | PRN
  Administered 2021-09-23: 100 mL via INTRAVENOUS

## 2021-09-23 MED ORDER — AMLODIPINE BESYLATE 5 MG PO TABS: 5.0000 mg | ORAL_TABLET | Freq: Every day | ORAL | 0 refills | Status: DC

## 2021-09-23 NOTE — ED Triage Notes (Addendum)
C/o weak, dizzy, HA, lateral neck throbbing, chest and bilateral jaw pressure. Onset at work 1100. Onset while walking and doing normal routine. Seen by corporate RN and instructed to go to ED. No aggravating or alleviating factors. Took ASA for HA this am at 0830. H/o similar, seen twice for same in last 2d, at [facility], and [facility]. Recent CXR, labs, and EKG reviewed. Verbalizes have been retaining fluid for ~ 2 weeks, lasix given yesterday.

## 2021-09-23 NOTE — ED Provider Notes (Incomplete)
[facility] EMERGENCY DEPARTMENT Provider Note   CSN: [id number] Arrival date & time: 09/23/21  1318     History {Add pertinent medical, surgical, social history, OB history to HPI:1} Chief Complaint  Patient presents with   Chest Pain    Emily Roy is a 46 y.o. female returning to the ED for continuation of chest pain with exertion.  At work today, none around 11 AM began having chest pain with "pressure in the neck" after exertion.  Went to get checked by a Research scientist (physical sciences) onsite 2 hours later, BP was about 185/120.  Encouraged to return to the ED.  Denies active chest pain, but felt pressure and shortness of breath when walking into the ED and getting to her room.  Rest usually relieves the symptoms, but now takes longer for symptoms to subside.  Hx of headaches, but these have increased in pain and duration as well.  Also with increasing leg swelling, especially with activity.  States her legs "tripled in size" causing her pants to rip.  Started Lasix this morning without much improvement.  No Hx of lung disorder such as asthma or COPD.  Denies N/V, recent upper respiratory infection, fevers, chills, cough, or tobacco use.  The history is provided by the patient and medical records.  Chest Pain      Home Medications Prior to Admission medications   Medication Sig Start Date End Date Taking? Authorizing Provider  furosemide (LASIX) 20 MG tablet Take 1 tablet (20 mg total) by mouth daily for 10 days. 09/22/21 10/02/21 Yes Edwin Dada P, DO  azithromycin (ZITHROMAX Z-PAK) 250 MG tablet 2 Tablets on the first day.  Then 1 tablet daily for the next 4 days. 08/05/20   Arby Barrette, MD  benzonatate (TESSALON PERLES) 100 MG capsule Take 2 capsules (200 mg total) by mouth 3 (three) times daily as needed for cough. Swallow capsule whole.  Do not chew or suck the capsule 08/05/20   Arby Barrette, MD  cephALEXin (KEFLEX) 500 MG capsule Take 1 capsule (500 mg total) by mouth 2 (two)  times daily. 03/02/16   Antony Madura, PA-C  naproxen (NAPROSYN) 500 MG tablet Take 1 tablet (500 mg total) by mouth 2 (two) times daily. 03/02/16   Antony Madura, PA-C  phenazopyridine (PYRIDIUM) 200 MG tablet Take 1 tablet (200 mg total) by mouth 3 (three) times daily. 03/02/16   Antony Madura, PA-C  predniSONE (DELTASONE) 20 MG tablet 2 tabs po daily x 3 days 08/05/20   Arby Barrette, MD      Allergies    Patient has no known allergies.    Review of Systems   Review of Systems  Cardiovascular:  Positive for chest pain.    Physical Exam Updated Vital Signs BP (!) 154/115   Pulse (!) 103   Temp 98.3 F (36.8 C) (Oral)   Resp (!) 22   Ht 5\' 7"  (1.702 m)   Wt (!) 152.9 kg   LMP 08/17/2021 (Approximate)   SpO2 96%   BMI 52.78 kg/m  Physical Exam Vitals and nursing note reviewed.  Constitutional:      General: She is not in acute distress.    Appearance: She is well-developed. She is not ill-appearing or diaphoretic.  HENT:     Head: Normocephalic and atraumatic.  Eyes:     Conjunctiva/sclera: Conjunctivae normal.  Neck:     Vascular: No JVD.  Cardiovascular:     Rate and Rhythm: Normal rate and regular rhythm.  Pulses:          Carotid pulses are 2+ on the right side and 2+ on the left side.      Radial pulses are 2+ on the right side and 2+ on the left side.       Dorsalis pedis pulses are 2+ on the right side and 2+ on the left side.     Heart sounds: Normal heart sounds. No murmur heard.    Comments: No carotid bruits appreciated. Pulmonary:     Effort: Pulmonary effort is normal. No tachypnea, accessory muscle usage or respiratory distress.     Breath sounds: Normal breath sounds. No decreased breath sounds or wheezing.     Comments: CTAB, able to communicate without difficulty, without increased respiratory effort Chest:     Chest wall: No tenderness.     Comments: Chest non-TTP  Abdominal:     Palpations: Abdomen is soft.     Tenderness: There is no  abdominal tenderness. There is no guarding or rebound.  Musculoskeletal:        General: No swelling.     Cervical back: Neck supple.     Right lower leg: Edema (Mild, non-pitting) present.     Left lower leg: Edema (Mild, non-pitting) present.     Comments: Without back or shoulder pain.  Skin:    General: Skin is warm and dry.     Capillary Refill: Capillary refill takes less than 2 seconds.  Neurological:     General: No focal deficit present.     Mental Status: She is alert and oriented to person, place, and time.  Psychiatric:        Mood and Affect: Mood normal.     ED Results / Procedures / Treatments   Labs (all labs ordered are listed, but only abnormal results are displayed) Labs Reviewed  CBC - Abnormal; Notable for the following components:      Result Value   RDW 16.1 (*)    All other components within normal limits  BASIC METABOLIC PANEL  TROPONIN I (HIGH SENSITIVITY)  TROPONIN I (HIGH SENSITIVITY)    EKG EKG Interpretation  Date/Time:  Thursday September 23 2021 13:34:06 EDT Ventricular Rate:  101 PR Interval:  170 QRS Duration: 74 QT Interval:  348 QTC Calculation: 451 R Axis:   97 Text Interpretation: Sinus tachycardia Rightward axis Low voltage QRS Borderline ECG When compared with ECG of 22-Sep-2021 07:58, No significant change since last tracing Confirmed by Meridee Score [phone]) on 09/23/2021 1:35:36 PM  Radiology CT Angio Chest PE W/Cm &/Or Wo Cm  Result Date: 09/23/2021 CLINICAL DATA:  Rule out pulmonary embolism. Complains of weakness, dizziness, headache, lateral neck throbbing, chest and bilateral jaw all pressure. EXAM: CT ANGIOGRAPHY CHEST WITH CONTRAST TECHNIQUE: Multidetector CT imaging of the chest was performed using the standard protocol during bolus administration of intravenous contrast. Multiplanar CT image reconstructions and MIPs were obtained to evaluate the vascular anatomy. RADIATION DOSE REDUCTION: This exam was performed according  to the departmental dose-optimization program which includes automated exposure control, adjustment of the mA and/or kV according to patient size and/or use of iterative reconstruction technique. CONTRAST:  OMNIPAQUE IOHEXOL 350 MG/ML SOLN COMPARISON:  None Available. FINDINGS: Cardiovascular: Satisfactory opacification of the pulmonary arteries to the segmental level. No evidence of pulmonary embolism. Mild cardiac enlargement.  No pericardial effusion. Mediastinum/Nodes: No enlarged mediastinal, hilar, or axillary lymph nodes. Thyroid gland, trachea, and esophagus demonstrate no significant findings. Lungs/Pleura: No pleural effusion. No  airspace consolidation, atelectasis or pneumothorax. Upper Abdomen: Subjective hepatic steatosis. There is hypertrophy of the lateral segment of left hepatic lobe as well as caudate lobe of liver. Gallstones are identified. Musculoskeletal: No chest wall abnormality. No acute or significant osseous findings. Review of the MIP images confirms the above findings. IMPRESSION: 1. No evidence for acute pulmonary embolism. 2. Subjective hepatic steatosis with hypertrophy of the lateral segment of left lobe of liver and caudate lobe of liver. These findings may be seen in the setting of early cirrhosis. 3. Gallstones. Electronically Signed   By: Signa Kell M.D.   On: 09/23/2021 18:34   CT Head Wo Contrast  Result Date: 09/23/2021 CLINICAL DATA:  Headache with increased frequency. Weakness and dizziness. EXAM: CT HEAD WITHOUT CONTRAST TECHNIQUE: Contiguous axial images were obtained from the base of the skull through the vertex without intravenous contrast. RADIATION DOSE REDUCTION: This exam was performed according to the departmental dose-optimization program which includes automated exposure control, adjustment of the mA and/or kV according to patient size and/or use of iterative reconstruction technique. COMPARISON:  None Available. FINDINGS: Brain: No evidence of acute  infarction, hemorrhage, hydrocephalus, extra-axial collection or mass lesion/mass effect. Vascular: Negative for hyperdense vessel Skull: Negative Sinuses/Orbits: Negative Other: None IMPRESSION: Normal CT head Electronically Signed   By: Marlan Palau M.D.   On: 09/23/2021 17:56   DG Chest 2 View  Result Date: 09/23/2021 CLINICAL DATA:  Chest pressure with weakness, dizziness, and palpitations. EXAM: CHEST - 2 VIEW COMPARISON:  Chest x-ray dated Aug 05, 2020. FINDINGS: The heart size and mediastinal contours are within normal limits. Both lungs are clear. The visualized skeletal structures are unremarkable. IMPRESSION: No active cardiopulmonary disease. Electronically Signed   By: Obie Dredge M.D.   On: 09/23/2021 16:02    Procedures Procedures  {Document cardiac monitor, telemetry assessment procedure when appropriate:1}  Medications Ordered in ED Medications  iohexol (OMNIPAQUE) 350 MG/ML injection 100 mL (100 mLs Intravenous Contrast Given 09/23/21 1811)    ED Course/ Medical Decision Making/ A&P                           Medical Decision Making Amount and/or Complexity of Data Reviewed External Data Reviewed: notes. Labs: ordered. Decision-making details documented in ED Course. Radiology: ordered and independent interpretation performed. Decision-making details documented in ED Course. ECG/medicine tests: ordered and independent interpretation performed. Decision-making details documented in ED Course.  Risk OTC drugs. Prescription drug management.   46 y.o. female presents to the ED for concern of Chest Pain   This involves an extensive number of treatment options, and is a complaint that carries with it a high risk of complications and morbidity.  The emergent differential diagnosis prior to evaluation includes, but is not limited to: ACS, GERD, dissection, pneumonia, pulmonary embolism, pneumothorax, costochondritis, anxiety  This is not an exhaustive differential.    Past Medical History / Co-morbidities / Social History: Hx of essential HTN, morbid obesity  Additional History:  Internal and external records from outside source obtained and reviewed including ED visits, PCP  Physical Exam: Physical exam performed. The pertinent findings include: Mild nonpitting edema bilateral extremities.  Lab Tests: I ordered, and personally interpreted labs.  The pertinent results include:   CBC: Unremarkable CMP/BMP: Unremarkable Troponin: Less than 2  Imaging Studies: I ordered imaging studies including CXR, CT head, and CTA chest.  I independently visualized and interpreted said imaging.  Pertinent results include: CXR: No active  cardiopulmonary disease CT head: Negative CTA chest:  1. Negative for PE 2. Subjective hepatic steatosis with hypertrophy of the lateral segment of left lobe of liver and caudate lobe of liver. May suggest early cirrhosis.  3. Gallstones I agree with the radiologist interpretation.  I personally ordered and interpreted EKG 12 lead findings, which showed: No significant change since last tracing  ED Course: Pt well-appearing on exam.  Third time back to ED in 3 days.  Complaining of continuing episodes of chest pain and pressure, especially with exertion.  Without active chest pain or dyspnea.  Also had an episode of symptomatic hypertension at work, with described increased pressure of the neck.  Without vision changes.  No neurodeficits or neurological complaints.  Low suspicion of CVA.  Without hypotension, muffled heart sounds, or JVD on exam.  EKG with low voltage, but when compared to multiple past EKGs, this is consistent.  Low suspicion for pericardial effusion.  No crackles or rales appreciated exam, CXR and troponins negative.  HEART score of 3.  Symptoms have since resolved, but remains tachycardic between 90-110 bpm with SPO2 99%.  Also complaining of increasing swelling of legs.  Nonpitting on exam and without calf  tenderness, erythema, or unilateral changes.  Low suspicion for DVT.  Placed order for CT head due to changes to and worsening of headaches, and CTA chest to assess for PE.  Upon reevaluation, patient remains unchanged.  Radiology negative for acute intracranial pathology or pulmonary embolism.  Low suspicion for ICH or SAH.  Patient in NAD in good condition at time of discharge.  Disposition: After consideration of the diagnostic results and the patient's encounter today, I feel that the emergency department workup does not suggest an emergent condition requiring admission or immediate intervention beyond what has been performed at this time.  The patient is safe for discharge and has been instructed to return immediately for worsening symptoms, change in symptoms or any other concerns.  I have reviewed the patients home medicines and have made adjustments as needed.  Discussed course of treatment thoroughly with the patient, whom demonstrated understanding.  Patient in agreement and has no further questions.    I discussed this case with my attending physician Dr. Charm Barges, who agreed with the proposed treatment course and cosigned this note including patient's presenting symptoms, physical exam, and planned diagnostics and interventions.  Attending physician stated agreement with plan or made changes to plan which were implemented.     This chart was dictated using voice recognition software.  Despite best efforts to proofread, errors can occur which can change the documentation meaning.   {Document critical care time when appropriate:1} {Document review of labs and clinical decision tools ie heart score, Chads2Vasc2 etc:1}  {Document your independent review of radiology images, and any outside records:1} {Document your discussion with family members, caretakers, and with consultants:1} {Document social determinants of health affecting pt's care:1} {Document your decision making why or why not  admission, treatments were needed:1} Final Clinical Impression(s) / ED Diagnoses Final diagnoses:  None    Rx / DC Orders ED Discharge Orders     None

## 2021-09-23 NOTE — ED Notes (Signed)
Updated primary RN and EDPA.

## 2021-09-23 NOTE — Discharge Instructions (Addendum)
You have been provided the contact information for a cardiology office.  Please call and schedule a follow-up appointment within the next few days for reevaluation continue medical management.  Also follow-up with a PCP, a list of resources been provided for you down below.  Please do so within the next few days to establish care and for reevaluation.  A blood pressure medication has been sent to your pharmacy by the name of amlodipine.  You may take 1 tablet daily for the next 30 days.  If you develop a rash, shortness of breath, anaphylaxis, stop taking this medication immediately return to the ED.  Return to the ED for new or worsening symptoms as discussed.  If you do not have a doctor see the list below.  RESOURCE GUIDE  Chronic Pain Problems: Contact [facility]  [phone] Patients need to be referred by their primary care doctor.  Insufficient Money for Medicine: Contact United Way:  call "211" or [facility] [phone].  No Primary Care Doctor: Call [facility]  [phone] - can help you locate a primary care doctor that  accepts your insurance, provides certain services, etc. Physician Referral Service- [phone] Agencies that provide inexpensive medical care: [facility]  [phone] [facility]  [phone] [facility]  [phone] [facility]  [phone] Planned Parenthood  [phone] [facility]  [phone]  Medicaid-accepting [facility] Providers: [facility]- [address]  [phone], Mon-Fri 9am-7pm, Sat 9am-1pm [facility]- [address] [place], Suite [place]  [phone] [facility]- [address], Suite [place]  [phone] [facility]- [address]  [phone] Renaye Rakers- [address] [place], [address], [phone]  Only accepts [place] patients after they have their name   applied to their card  Self Pay (no insurance) in [facility]: Sickle Cell Patients: Dr Willey Blade, [facility]  [address] [place], [phone] [facility]- [address] [place]  [phone]       [facility] [place]- [address]       -     [facility]- see information above (Speak to [employer] if you do not have insurance)       -  [facility]- [address] [place], [phone]       -  [facility]- [address],  [phone]       -  [facility]- [address] [phone]       -  Dr Julio Sicks-  [address], [place], [phone]       -  [facility]- [address] [phone]       -  [facility]- [address], [phone], also [address] [phone]       -    [facility]- [address] [place], [phone], 1st & 3rd Saturday   every month, 10am-1pm  1) Find a Doctor and Pay Out of Pocket Although you won't have to find out who is covered by your insurance plan, it is a good idea to ask around and get recommendations. You will then need to call the office and see if the doctor you have chosen will accept you as a new patient and  what types of options they offer for patients who are self-pay. Some doctors offer discounts or will set up payment plans for their patients who do not have insurance, but you will need to ask so you aren't surprised when you get to your appointment.  2) Contact Your Local Health Department Not all health departments have doctors that can see patients for sick visits, but many do, so it is worth a call to see if yours does. If you don't know where your local health department is, you can check in your phone book. The CDC also has a tool to help you locate your state's health department, and many state websites also have listings of all of their local health departments.  3) Find a Walk-in Clinic If your  illness is not likely to be very severe or complicated, you may want to try a walk in clinic. These are popping up all over the country in pharmacies, drugstores, and shopping centers. They're usually staffed by nurse practitioners or physician assistants that have been trained to treat common illnesses and complaints. They're usually fairly quick and inexpensive. However, if you have serious medical issues or chronic medical problems, these are probably not your best option  STD Testing [facility] Department of [facility] [place], STD Clinic, [address], [place], phone [phone] or [phone].  Monday - Friday, call for an appointment. [facility] Department of [employer], STD Clinic, [place] [address], [place], phone [phone] or [phone].  Monday - Friday, call for an appointment.  Abuse/Neglect: [facility] Child Abuse Hotline (564)642-0943 [facility] Child Abuse Hotline 219 154 2393 (After Hours)  Emergency Shelter:  [facility] 539-487-6738  Maternity Homes: [facility] at the [place] [facility] ([phone] [facility] [phone]  MRSA Hotline #:   248-475-5003  [facility] Resources  [facility] [place]     United Way                          [facility] Dept. [address] [place]                       [address]      [address]  Blondell Reveal Phone:  [phone]                                   Phone:  [phone]                 Phone:  [phone]  [facility], [phone] [facility] [phone]       -     [facility] in [place], [address]  [phone], [place] [phone] or [phone] (After Hours)  [place]  Substance Abuse Resources: Alcohol and Drug Services  [place] (715) [phone] [facility] [place] Chinita Pester [phone] Residential & Outpatient Substance Abuse Program  248-703-6170  Psychological Services: [place]  [phone] [place]  [place], [place] [address], [place], [place]: [phone] or [phone], [employer]  Dental Assistance  Patients with Medicaid: [place] 747-560-5135 [address] Phone:  [phone]                                                  Phone:  [phone]  If unable to pay or uninsured, contact:  [facility] or [facility]. to become qualified for the adult dental clinic.  Patients with Medicaid: [facility] [phone] [address], [place] [address] [phone]  If unable to pay, or uninsured, contact [facility] [phone]) or [place] [phone] in [place], [place] in [facility]) to become qualified for the adult dental clinic  Other [place]- [place], [place], [place], [address], [place], [place], 2nd and 4th Thursday of the month at 6:30am.  10 clients each day by appointment, can sometimes see walk-in patients if someone does not show for an appointment. [facility]- [address] [place] Danker [place], [place], [address], [place], [place], [place], [address], [place] Department- [phone] [place] [facility] [phone]

## 2021-09-23 NOTE — ED Notes (Signed)
Patient transported to CT 

## 2021-09-24 ENCOUNTER — Encounter (HOSPITAL_COMMUNITY): Payer: Self-pay | Admitting: Emergency Medicine

## 2021-09-28 ENCOUNTER — Emergency Department (HOSPITAL_BASED_OUTPATIENT_CLINIC_OR_DEPARTMENT_OTHER): Payer: BC Managed Care – PPO

## 2021-09-28 ENCOUNTER — Encounter (HOSPITAL_BASED_OUTPATIENT_CLINIC_OR_DEPARTMENT_OTHER): Payer: Self-pay | Admitting: Emergency Medicine

## 2021-09-28 ENCOUNTER — Other Ambulatory Visit: Payer: Self-pay

## 2021-09-28 ENCOUNTER — Emergency Department (HOSPITAL_BASED_OUTPATIENT_CLINIC_OR_DEPARTMENT_OTHER)
Admission: EM | Admit: 2021-09-28 | Discharge: 2021-09-28 | Disposition: A | Discharge status: Home / Self Care | Payer: BC Managed Care – PPO | Attending: Emergency Medicine | Admitting: Emergency Medicine

## 2021-09-28 DIAGNOSIS — Z79899 Other long term (current) drug therapy: Secondary | ICD-10-CM | POA: Insufficient documentation

## 2021-09-28 DIAGNOSIS — N939 Abnormal uterine and vaginal bleeding, unspecified: Secondary | ICD-10-CM

## 2021-09-28 DIAGNOSIS — R000 Tachycardia, unspecified: Secondary | ICD-10-CM | POA: Diagnosis not present

## 2021-09-28 DIAGNOSIS — R531 Weakness: Secondary | ICD-10-CM | POA: Diagnosis not present

## 2021-09-28 DIAGNOSIS — N920 Excessive and frequent menstruation with regular cycle: Secondary | ICD-10-CM | POA: Diagnosis not present

## 2021-09-28 DIAGNOSIS — R5383 Other fatigue: Secondary | ICD-10-CM | POA: Diagnosis not present

## 2021-09-28 DIAGNOSIS — I10 Essential (primary) hypertension: Secondary | ICD-10-CM | POA: Diagnosis not present

## 2021-09-28 DIAGNOSIS — N921 Excessive and frequent menstruation with irregular cycle: Secondary | ICD-10-CM | POA: Diagnosis not present

## 2021-09-28 LAB — CBC WITH DIFFERENTIAL/PLATELET
Abs Immature Granulocytes: 0.04 10*3/uL (ref 0.00–0.07)
Basophils Absolute: 0.1 10*3/uL (ref 0.0–0.1)
Basophils Relative: 1 %
Eosinophils Absolute: 0.1 10*3/uL (ref 0.0–0.5)
Eosinophils Relative: 1 %
HCT: 37.4 % (ref 36.0–46.0)
Hemoglobin: 11.7 g/dL — ABNORMAL LOW (ref 12.0–15.0)
Immature Granulocytes: 0 %
Lymphocytes Relative: 25 %
Lymphs Abs: 2.6 10*3/uL (ref 0.7–4.0)
MCH: 25.5 pg — ABNORMAL LOW (ref 26.0–34.0)
MCHC: 31.3 g/dL (ref 30.0–36.0)
MCV: 81.5 fL (ref 80.0–100.0)
Monocytes Absolute: 0.6 10*3/uL (ref 0.1–1.0)
Monocytes Relative: 5 %
Neutro Abs: 7 10*3/uL (ref 1.7–7.7)
Neutrophils Relative %: 68 %
Platelets: 293 10*3/uL (ref 150–400)
RBC: 4.59 MIL/uL (ref 3.87–5.11)
RDW: 15.9 % — ABNORMAL HIGH (ref 11.5–15.5)
WBC: 10.3 10*3/uL (ref 4.0–10.5)
nRBC: 0 % (ref 0.0–0.2)

## 2021-09-28 LAB — URINALYSIS, ROUTINE W REFLEX MICROSCOPIC

## 2021-09-28 LAB — BASIC METABOLIC PANEL
Anion gap: 9 (ref 5–15)
BUN: 19 mg/dL (ref 6–20)
CO2: 23 mmol/L (ref 22–32)
Calcium: 9.2 mg/dL (ref 8.9–10.3)
Chloride: 106 mmol/L (ref 98–111)
Creatinine, Ser: 0.86 mg/dL (ref 0.44–1.00)
GFR, Estimated: >60 mL/min (ref >60)
Glucose, Bld: 99 mg/dL (ref 70–99)
Potassium: 4.2 mmol/L (ref 3.5–5.1)
Sodium: 138 mmol/L (ref 135–145)

## 2021-09-28 LAB — URINALYSIS, MICROSCOPIC (REFLEX)
Bacteria, UA: NONE SEEN
RBC / HPF: >50 RBC/hpf (ref 0–5)

## 2021-09-28 LAB — PREGNANCY, URINE: Preg Test, Ur: NEGATIVE

## 2021-09-28 MED ORDER — MEGESTROL ACETATE 40 MG PO TABS: 40.0000 mg | ORAL_TABLET | Freq: Two times a day (BID) | ORAL | 0 refills | Status: AC

## 2021-09-28 NOTE — Consult Note (Addendum)
   OB/GYN Telephone Consult  Emily Roy is a 46 y.o.  presenting with AUB for the last month.    I was called for a consult regarding the care of this patient by [facility] .    The provider had a clinical question regarding followup and management as well as the findings by [place].   The provider presented the following relevant clinical information and I performed a chart review on the patient and reviewed available documentation:  She has bleeding daily for the last month but since Saturday it worsened which is what prompted her to come in for evaluation.   I reviewed her pelvic ultrasound images independently and my findings are: similar to the radiologist reading. She does have about a 2 cm fibroid that is intramural and does not appear to communicate with the EL. The EL is thickened at 19 mm c/w with her bleeding. I suspect the increased vascularity is due to the bleeding related to her EL and c/w her description of the bleeding.   I reviewed CareEverywhere: Her last pap was in 2016 and was normal. No HPV was done at that time.   BP (!) 144/108   Pulse (!) 111   Temp 97.9 F (36.6 C)   Resp 18   Ht 5\' 7"  (1.702 m)   Wt (!) 152 kg   LMP 08/17/2021 (Approximate)   SpO2 96%   BMI 52.47 kg/m   Exam- performed by consulting provider  CBC    Component Value Date/Time   WBC 10.3 09/28/2021 1830   RBC 4.59 09/28/2021 1830   HGB 11.7 (L) 09/28/2021 1830   HCT 37.4 09/28/2021 1830   PLT 293 09/28/2021 1830   MCV 81.5 09/28/2021 1830   MCH 25.5 (L) 09/28/2021 1830   MCHC 31.3 09/28/2021 1830   RDW 15.9 (H) 09/28/2021 1830   LYMPHSABS 2.6 09/28/2021 1830   MONOABS 0.6 09/28/2021 1830   EOSABS 0.1 09/28/2021 1830   BASOSABS 0.1 09/28/2021 1830      Recommendations:  - Recommend EMB in the office given AUB and risk factors for endometrial cancer, I.e. BMI. Can be do to being perimenopausal, but would still recommend EMB. I do not think fibroid is contributing  to her bleeding based on location.  - Megace 40 BID until seen in the office - Message sent to [facility] and [facility] for follow up within  2 weeks.   Thank you for this consult and if additional recommendations are needed please call [phone] for the OB/GYN attending on service at [facility].   I spent approximately 5 minutes directly consulting with the provider and verbally discussing this case. Additionally 10 minutes minutes was spent performing chart review and documentation.   Milas Hock, MD Attending Obstetrician & Gynecologist, [facility] for [facility], [facility]

## 2021-09-28 NOTE — ED Notes (Signed)
Still need to collect urine.

## 2021-09-28 NOTE — ED Provider Notes (Signed)
[facility] EMERGENCY DEPT Provider Note   CSN: [id number] Arrival date & time: 09/28/21  1619     History  Chief Complaint  Patient presents with   Menorrhagia    Emily Roy is a 46 y.o. female who presents to the emergency department for evaluation of weakness, fatigue and heavy menstrual bleeding that has been ongoing for 1 month and significantly worsened in the last 3 days.  Patient states that she is now soaking through pads every 20-30 minutes.  She is having abdominal cramping that seems to radiate deep into her right lower back.  Pain is constant.  Of note, patient has been seen both here and at [facility] in the last week.  On 09/22/2021 she was seen for elevated blood pressure with shortness of breath and noted to have water retention of the upper and lower extremities.  She was discharged home with Lasix to take daily. She then returned the next day to [facility] for evaluation of chest pain with overall normal work-up.  She was found to be mildly hypertensive and started on amlodipine.  Patient does not have a primary care doctor or an OB/GYN.  HPI     Home Medications Prior to Admission medications   Medication Sig Start Date End Date Taking? Authorizing Provider  megestrol (MEGACE) 40 MG tablet Take 1 tablet (40 mg total) by mouth 2 (two) times daily. 09/28/21 10/28/21 Yes Raynald Blend R, PA-C  amLODipine (NORVASC) 5 MG tablet Take 1 tablet (5 mg total) by mouth daily. 09/23/21   Cecil Cobbs, PA-C  furosemide (LASIX) 20 MG tablet Take 1 tablet (20 mg total) by mouth daily for 10 days. 09/22/21 10/02/21  Edwin Dada P, DO  phenazopyridine (PYRIDIUM) 200 MG tablet Take 1 tablet (200 mg total) by mouth 3 (three) times daily. 03/02/16   Antony Madura, PA-C      Allergies    Patient has no known allergies.    Review of Systems   Review of Systems  Physical Exam Updated Vital Signs BP (!) 144/108   Pulse (!) 111   Temp 97.9 F (36.6 C)    Resp 18   Ht 5\' 7"  (1.702 m)   Wt (!) 152 kg   LMP 08/17/2021 (Approximate)   SpO2 96%   BMI 52.47 kg/m  Physical Exam Vitals and nursing note reviewed.  Constitutional:      General: She is not in acute distress.    Appearance: She is obese. She is not ill-appearing.  HENT:     Head: Atraumatic.  Eyes:     Conjunctiva/sclera: Conjunctivae normal.  Cardiovascular:     Rate and Rhythm: Normal rate and regular rhythm.     Pulses: Normal pulses.     Heart sounds: No murmur heard. Pulmonary:     Effort: Pulmonary effort is normal. No respiratory distress.     Breath sounds: Normal breath sounds.  Abdominal:     General: There is no distension.     Palpations: Abdomen is soft.     Tenderness: There is no abdominal tenderness.     Comments: Abdomen is rounded, soft and nontender to palpation.  Musculoskeletal:        General: Normal range of motion.     Cervical back: Normal range of motion.  Skin:    General: Skin is warm and dry.     Capillary Refill: Capillary refill takes less than 2 seconds.  Neurological:     General: No focal deficit present.  Mental Status: She is alert.  Psychiatric:        Mood and Affect: Mood normal.     ED Results / Procedures / Treatments   Labs (all labs ordered are listed, but only abnormal results are displayed) Labs Reviewed  URINALYSIS, ROUTINE W REFLEX MICROSCOPIC - Abnormal; Notable for the following components:      Result Value   Color, Urine RED (*)    APPearance TURBID (*)    Glucose, UA   (*)    Value: TEST NOT REPORTED DUE TO COLOR INTERFERENCE OF URINE PIGMENT   Hgb urine dipstick   (*)    Value: TEST NOT REPORTED DUE TO COLOR INTERFERENCE OF URINE PIGMENT   Bilirubin Urine   (*)    Value: TEST NOT REPORTED DUE TO COLOR INTERFERENCE OF URINE PIGMENT   Ketones, ur   (*)    Value: TEST NOT REPORTED DUE TO COLOR INTERFERENCE OF URINE PIGMENT   Protein, ur   (*)    Value: TEST NOT REPORTED DUE TO COLOR INTERFERENCE OF  URINE PIGMENT   Nitrite   (*)    Value: TEST NOT REPORTED DUE TO COLOR INTERFERENCE OF URINE PIGMENT   Leukocytes,Ua   (*)    Value: TEST NOT REPORTED DUE TO COLOR INTERFERENCE OF URINE PIGMENT   All other components within normal limits  CBC WITH DIFFERENTIAL/PLATELET - Abnormal; Notable for the following components:   Hemoglobin 11.7 (*)    MCH 25.5 (*)    RDW 15.9 (*)    All other components within normal limits  PREGNANCY, URINE  BASIC METABOLIC PANEL  URINALYSIS, MICROSCOPIC (REFLEX)    EKG None  Radiology US PELVIC COMPLETE WITH TRANSVAGINAL  Result Date: 09/28/2021 CLINICAL DATA:  Menorrhagia EXAM: TRANSABDOMINAL AND TRANSVAGINAL ULTRASOUND OF PELVIS TECHNIQUE: Both transabdominal and transvaginal ultrasound examinations of the pelvis were performed. Transabdominal technique was performed for global imaging of the pelvis including uterus, ovaries, adnexal regions, and pelvic cul-de-sac. It was necessary to proceed with endovaginal exam following the transabdominal exam to visualize the endometrium and ovaries. COMPARISON:  None Available. FINDINGS: Uterus Measurements: 11.4 x 6 x 7.1 cm = volume: 252.6 mL. There is 1.4 x 1.3 cm hypoechoic structure in the anterior lower uterine segment, possibly fibroid. Endometrium Thickness: 1.9 cm. There is increased vascularity in the in the endometrium in the lower uterine segment. Right ovary Not optimally visualized.  No dominant adnexal masses are seen. Left ovary Not sonographically visualized. There are no dominant adnexal masses. Other findings No abnormal free fluid. IMPRESSION: There is inhomogeneous echogenicity in myometrium. There is 1.4 cm hypoechoic structure in the anterior lower uterine segment suggesting possible fibroid. Endometrial stripe is prominent measuring up to 1.9 cm. There is increased vascularity in the endometrium in the lower uterine segment. Short-term follow-up pelvic sonogram in the 1-2 months along with  sonohysterogram as warranted should be considered. Ovaries are not adequately visualized for evaluation. As far as seen, there are no dominant adnexal masses. Electronically Signed   By: Ernie Avena M.D.   On: 09/28/2021 20:08    Procedures Procedures    Medications Ordered in ED Medications - No data to display  ED Course/ Medical Decision Making/ A&P                           Medical Decision Making Amount and/or Complexity of Data Reviewed Labs: ordered. Radiology: ordered.  Risk Prescription drug management.   Social determinants  of health:  Social History   Socioeconomic History   Marital status: Divorced    Spouse name: Not on file   Number of children: Not on file   Years of education: Not on file   Highest education level: Not on file  Occupational History   Not on file  Tobacco Use   Smoking status: Never   Smokeless tobacco: Never  Substance and Sexual Activity   Alcohol use: No   Drug use: Not on file   Sexual activity: Not on file  Other Topics Concern   Not on file  Social History Narrative   Not on file   Social Determinants of Health   Financial Resource Strain: Not on file  Food Insecurity: Not on file  Transportation Needs: Not on file  Physical Activity: Not on file  Stress: Not on file  Social Connections: Not on file  Intimate Partner Violence: Not on file     Initial impression:  This patient presents to the ED for concern of weakness, fatigue with heavy menstrual bleeding, this involves an extensive number of treatment options, and is a complaint that carries with it a high risk of complications and morbidity.   Differentials include uterine fibroids, endometrial polyp, adenomyosis, bleeding disorder.   Comorbidities affecting care:  Obesity, hypertension  Additional history obtained: Labs and imaging obtained from her visits on 09/22/2021 and 09/23/2021-Patient had negative CTA. Incidental finding of the CTA notable for  hepatic steatosis of the left lobe of liver possibly seen in early cirrhosis, chest x-ray normal.  Head CT normal hemoglobin and hematocrit normal.  Lab Tests  I Ordered, reviewed, and interpreted labs and EKG.  The pertinent results include:  Hemoglobin 11.7 BMP normal UA unable to be read due to large amounts of blood Negative pregnancy  Imaging Studies ordered:  I ordered imaging studies including  Pelvic ultrasound which shows some endometrial thickening I independently visualized and interpreted imaging and I agree with the radiologist interpretation.    Consultations Obtained:  I requested consultation with OB/GYN and spoke with Dr. Para March,  and discussed lab and imaging findings as well as pertinent plan -she personally reviewed the imaging and noted the small anterior fibroid but she does not believe that this is the cause of patient's bleeding.  She does note that the endometrial lining is thickened but this is likely caused by the bleeding rather than the cause of the bleeding.  She recommends starting patient on Megace 40 mg twice daily and following up with her office for endometrial biopsy.  She states that a staff member from her office will contact patient to set up appointment.   ED Course/Re-evaluation: 46 year old female presents emergency department for evaluation of weakness, fatigue with heavy bleeding x1 month.  On exam, patient is mildly hypertensive but she is tachycardic to about 110 to 115 bpm.  Her abdomen is rounded, soft and nondistended.  She does not have reproducible tenderness to palpation although she has cramping across the lower pelvic region that radiates deep into the right side.  Hemoglobin 11.7.  BMP was normal.  UA without obvious signs of infection although difficult to read due to degree of bleeding.  I obtained ultrasound which did mention a small anterior uterine fibroid.  I consulted with OB/GYN with details as described above.  Patient was  tachycardic to about 110 to 115 bpm while she was here in the emergency department.  I did consider possible causes including PE or anemia, however patient's hemoglobin was  normal today.  She also had a D-dimer done just a few days ago so I do not think that repeat was indicated at this time.  She is also without chest pain.  Patient states that her resting heart rate is usually between 90 and 105 bpm.  Given her very reassuring cardiac work-up a few days ago and her labs today, I believe patient is stable for discharge with strict return precautions and outpatient follow-up.  Disposition:  After consideration of the diagnostic results, physical exam, history and the patients response to treatment feel that the patent would benefit from discharge.   Menorrhagia with irregular cycle: Plan and management as described above. Discharged home in good condition.  Final Clinical Impression(s) / ED Diagnoses Final diagnoses:  Menorrhagia with irregular cycle    Rx / DC Orders ED Discharge Orders          Ordered    megestrol (MEGACE) 40 MG tablet  2 times daily        09/28/21 2033              Janell Quiet, [place] 09/28/21 2148    Vanetta Mulders, MD 10/03/21 [phone]

## 2021-09-28 NOTE — ED Triage Notes (Addendum)
Pt from c/o of heavy periods since may 16 that have gotten progressively worse with increased bleeding. Pt c/o  mid to lower right back pain that is constant.   Pt c/o of weakness, generalized fatigue along with light headedness and dizziness.   Pt was [facility] on Friday and [facility] on Saturday r/t same.

## 2021-09-29 ENCOUNTER — Other Ambulatory Visit (HOSPITAL_COMMUNITY): Payer: Self-pay

## 2021-10-06 ENCOUNTER — Encounter (HOSPITAL_BASED_OUTPATIENT_CLINIC_OR_DEPARTMENT_OTHER): Payer: Self-pay | Admitting: *Deleted

## 2021-10-06 ENCOUNTER — Telehealth (HOSPITAL_BASED_OUTPATIENT_CLINIC_OR_DEPARTMENT_OTHER): Payer: Self-pay | Admitting: *Deleted

## 2021-10-06 NOTE — Telephone Encounter (Signed)
Attempted to call pt to schedule appt. Voicemail full. Unable to leave message

## 2021-10-07 ENCOUNTER — Other Ambulatory Visit: Payer: Self-pay

## 2021-10-07 ENCOUNTER — Emergency Department (HOSPITAL_BASED_OUTPATIENT_CLINIC_OR_DEPARTMENT_OTHER)
Admission: EM | Admit: 2021-10-07 | Discharge: 2021-10-07 | Disposition: A | Discharge status: Home / Self Care | Payer: BC Managed Care – PPO | Attending: Emergency Medicine | Admitting: Emergency Medicine

## 2021-10-07 ENCOUNTER — Encounter (HOSPITAL_BASED_OUTPATIENT_CLINIC_OR_DEPARTMENT_OTHER): Payer: Self-pay | Admitting: Emergency Medicine

## 2021-10-07 ENCOUNTER — Emergency Department (HOSPITAL_BASED_OUTPATIENT_CLINIC_OR_DEPARTMENT_OTHER): Payer: BC Managed Care – PPO

## 2021-10-07 DIAGNOSIS — I808 Phlebitis and thrombophlebitis of other sites: Secondary | ICD-10-CM

## 2021-10-07 DIAGNOSIS — M79621 Pain in right upper arm: Secondary | ICD-10-CM | POA: Diagnosis not present

## 2021-10-07 DIAGNOSIS — I10 Essential (primary) hypertension: Secondary | ICD-10-CM | POA: Insufficient documentation

## 2021-10-07 DIAGNOSIS — Z79899 Other long term (current) drug therapy: Secondary | ICD-10-CM | POA: Insufficient documentation

## 2021-10-07 DIAGNOSIS — I82611 Acute embolism and thrombosis of superficial veins of right upper extremity: Secondary | ICD-10-CM | POA: Insufficient documentation

## 2021-10-07 DIAGNOSIS — R600 Localized edema: Secondary | ICD-10-CM | POA: Diagnosis not present

## 2021-10-07 MED ORDER — IBUPROFEN 800 MG PO TABS
800.0000 mg | ORAL_TABLET | Freq: Once | ORAL | Status: AC
  Administered 2021-10-07: 800 mg via ORAL
  Filled 2021-10-07: qty 1

## 2021-10-07 NOTE — ED Provider Notes (Signed)
[facility] EMERGENCY DEPT Provider Note   CSN: [id number] Arrival date & time: 10/07/21  0915     History  Chief Complaint  Patient presents with   Arm Pain    Emily Roy is a 46 y.o. female.  HPI 46 year old female history of hypertension presents today complaining of redness has some pain and swelling in her right upper extremity.  This began after an IV on June 27.  Its been there for about the past 10 days she has noticed slight increase in the redness.  There is no numbness or tingling into her hand.  She has not had any fever or chills and there is no discharge noted.    Home Medications Prior to Admission medications   Medication Sig Start Date End Date Taking? Authorizing Provider  amLODipine (NORVASC) 5 MG tablet Take 1 tablet (5 mg total) by mouth daily. 09/23/21   Cecil Cobbs, PA-C  furosemide (LASIX) 20 MG tablet Take 1 tablet (20 mg total) by mouth daily for 10 days. 09/22/21 10/02/21  Edwin Dada P, DO  megestrol (MEGACE) 40 MG tablet Take 1 tablet (40 mg total) by mouth 2 (two) times daily. 09/28/21 10/28/21  Janell Quiet, PA-C  phenazopyridine (PYRIDIUM) 200 MG tablet Take 1 tablet (200 mg total) by mouth 3 (three) times daily. 03/02/16   Antony Madura, PA-C      Allergies    Patient has no known allergies.    Review of Systems   Review of Systems  Physical Exam Updated Vital Signs BP (!) 158/119 (BP Location: Left Wrist)   Pulse (!) 107   Temp 98.8 F (37.1 C)   Resp 18   Ht 1.702 m (5\' 7" )   Wt (!) 152 kg   LMP 08/17/2021 (Approximate)   SpO2 98%   BMI 52.47 kg/m  Physical Exam Vitals and nursing note reviewed.  Constitutional:      General: She is not in acute distress.    Appearance: She is well-developed and normal weight.  HENT:     Head: Normocephalic and atraumatic.     Right Ear: External ear normal.     Left Ear: External ear normal.     Nose: Nose normal.  Eyes:     Conjunctiva/sclera: Conjunctivae  normal.     Pupils: Pupils are equal, round, and reactive to light.  Pulmonary:     Effort: Pulmonary effort is normal.  Musculoskeletal:        General: Normal range of motion.     Cervical back: Normal range of motion and neck supple.     Comments: Right upper extremity with some mild redness with some streaking and mild tenderness  Skin:    General: Skin is warm and dry.  Neurological:     Mental Status: She is alert and oriented to person, place, and time.     Motor: No abnormal muscle tone.     Coordination: Coordination normal.  Psychiatric:        Behavior: Behavior normal.        Thought Content: Thought content normal.      ED Results / Procedures / Treatments   Labs (all labs ordered are listed, but only abnormal results are displayed) Labs Reviewed - No data to display  EKG None  Radiology 08/19/2021 Venous Img Upper Uni Right(DVT)  Result Date: 10/07/2021 CLINICAL DATA:  Right upper extremity pain and edema for the past 3-4 days. Evaluate for DVT. EXAM: RIGHT UPPER EXTREMITY VENOUS DOPPLER ULTRASOUND  TECHNIQUE: Gray-scale sonography with graded compression, as well as color Doppler and duplex ultrasound were performed to evaluate the upper extremity deep venous system from the level of the subclavian vein and including the jugular, axillary, basilic, radial, ulnar and upper cephalic vein. Spectral Doppler was utilized to evaluate flow at rest and with distal augmentation maneuvers. COMPARISON:  None Available. FINDINGS: Contralateral Subclavian Vein: Respiratory phasicity is normal and symmetric with the symptomatic side. No evidence of thrombus. Normal compressibility. Internal Jugular Vein: No evidence of thrombus. Normal compressibility, respiratory phasicity and response to augmentation. Subclavian Vein: No evidence of thrombus. Normal compressibility, respiratory phasicity and response to augmentation. Axillary Vein: No evidence of thrombus. Normal compressibility, respiratory  phasicity and response to augmentation. Cephalic Vein: There is mixed echogenic occlusive thrombus involving the right cephalic vein (images 26 through 28). Basilic Vein: No evidence of thrombus. Normal compressibility, respiratory phasicity and response to augmentation. Brachial Veins: No evidence of thrombus. Normal compressibility, respiratory phasicity and response to augmentation. Radial Veins: No evidence of thrombus. Normal compressibility, respiratory phasicity and response to augmentation. Ulnar Veins: No evidence of thrombus. Normal compressibility, respiratory phasicity and response to augmentation. Other Findings:  None visualized. IMPRESSION: 1. The examination is positive for age-indeterminate occlusive superficial thrombophlebitis involving the right cephalic vein. 2. No extension of this occlusive SVT to the deep venous system of the right upper extremity. Electronically Signed   By: Simonne Come M.D.   On: 10/07/2021 11:00    Procedures Procedures    Medications Ordered in ED Medications  ibuprofen (ADVIL) tablet 800 mg (has no administration in time range)    ED Course/ Medical Decision Making/ A&P Clinical Course as of 10/07/21 1127  Thu Oct 07, 2021  1113 Ultrasound performed and significant for superficial thrombophlebitis involving right cephalic vein without extension of this occlusive SVT to the deep venous system of the right upper extremity [DR]    Clinical Course User Index [DR] Margarita Grizzle, MD                           Medical Decision Making 46 year old female with poorly controlled hypertension presents today with redness swelling right upper extremity.  Differential diagnosis includes DVT, thrombophlebitis, cellulitis, other infections. Ultrasound was obtained through the radiology department and is significant for a superficial thrombophlebitis of on the right cephalic vein but no evidence of occlusive SVT or extension to the deep system. Patient has no prior  history of DVT or clotting disorder. We will initially treat with limited course of nonsteroidal anti-inflammatory, warm compresses, and elevation Patient advised regarding short course of nonsteroidal anti-inflammatories as her blood pressure is significantly elevated.  Her renal function was checked and is normal We discussed return precautions including increased swelling, pain, chest pain, or dyspnea.  She will follow-up with primary care   Amount and/or Complexity of Data Reviewed Radiology: ordered and independent interpretation performed. Decision-making details documented in ED Course.           Final Clinical Impression(s) / ED Diagnoses Final diagnoses:  Superficial thrombophlebitis of right upper extremity    Rx / DC Orders ED Discharge Orders     None         Margarita Grizzle, MD 10/07/21 1127

## 2021-10-07 NOTE — ED Notes (Signed)
RN provided AVS using Teachback Method. Patient verbalizes understanding of Discharge Instructions. Opportunity for Questioning and Answers were provided by RN. Patient Discharged from ED ambulatory to Home via Self.  

## 2021-10-07 NOTE — ED Triage Notes (Signed)
Pt arrives to ED with c/o upper right arm pain. Pt reports the pain started after an IV was removed from the same arm on 6/27. Pt reports the arm pain has worsened is tender to the touch, with a constant dull achy pain.

## 2021-10-07 NOTE — Discharge Instructions (Addendum)
Please take ibuprofen 400 mg 3 times a day for 3 days Keep extremity elevated Use warm compresses Return if you are having worsening redness or pain

## 2021-10-14 ENCOUNTER — Ambulatory Visit: Payer: BC Managed Care – PPO | Admitting: Family

## 2021-10-14 ENCOUNTER — Encounter: Payer: Self-pay | Admitting: Family

## 2021-10-14 VITALS — BP 170/98 | HR 90 | Temp 98.0°F | Ht 67.0 in | Wt 328.1 lb

## 2021-10-14 DIAGNOSIS — Z86718 Personal history of other venous thrombosis and embolism: Secondary | ICD-10-CM | POA: Insufficient documentation

## 2021-10-14 DIAGNOSIS — F32A Depression, unspecified: Secondary | ICD-10-CM | POA: Diagnosis not present

## 2021-10-14 DIAGNOSIS — I10 Essential (primary) hypertension: Secondary | ICD-10-CM | POA: Diagnosis not present

## 2021-10-14 DIAGNOSIS — N924 Excessive bleeding in the premenopausal period: Secondary | ICD-10-CM | POA: Insufficient documentation

## 2021-10-14 DIAGNOSIS — I8390 Asymptomatic varicose veins of unspecified lower extremity: Secondary | ICD-10-CM | POA: Insufficient documentation

## 2021-10-14 DIAGNOSIS — F419 Anxiety disorder, unspecified: Secondary | ICD-10-CM

## 2021-10-14 MED ORDER — ESCITALOPRAM OXALATE 10 MG PO TABS: 10.0000 mg | ORAL_TABLET | Freq: Every day | ORAL | 1 refills | Status: DC

## 2021-10-14 MED ORDER — FUROSEMIDE 20 MG PO TABS: 20.0000 mg | ORAL_TABLET | Freq: Every day | ORAL | 0 refills | Status: DC

## 2021-10-14 MED ORDER — AMLODIPINE BESYLATE 10 MG PO TABS: 10.0000 mg | ORAL_TABLET | Freq: Every day | ORAL | 2 refills | Status: DC

## 2021-10-14 NOTE — Assessment & Plan Note (Addendum)
Chronic  hx of taking Lexapro, no SE, reports worked well, no other meds tried or failed  refilling Lexapro 10mg   both parents died in 07/20/18  f/u 1 mo

## 2021-10-14 NOTE — Progress Notes (Signed)
New Patient Office Visit  Subjective:  Patient ID: Emily Roy, female    DOB: 1976-01-29  Age: 46 y.o. MRN: [id number]  CC:  Chief Complaint  Patient presents with   Establish Care    Biopsy Tomorrow scheduled.    Follow-up    ED follow up, constant swelling(Lasix), HTN, Menstrual bleeding clots(Megace), Liver problem.   Hypertension   Depression    Discuss medications    HPI Emily Roy presents for establishing care today. Hypertension: Patient is currently maintained on the following medications for blood pressure: Amlodipine & Lasix  Patient reports good compliance with blood pressure medications. Patient denies chest pain, but reports dizziness, headaches, & overall swelling. Seen In ER recently,  Last 3 blood pressure readings in our office are as follows: BP Readings from Last 3 Encounters:  10/14/21 (!) 170/98  10/07/21 (!) 148/93  09/28/21 130/88  Anxiety/Depression: Patient complains of anxiety disorder and depression. She has the following symptoms: chest pain, difficulty concentrating, fatigue, feelings of losing control, irritable, palpitations, racing thoughts, shortness of breath, sweating.  Onset of symptoms was approximately  years ago, She denies current suicidal and homicidal ideation.  Risk factors: negative life event recent medical problems and previous episode of depression  Previous treatment includes Lexapro.  She complains of the following side effects from the treatment: none.    10/14/2021    3:19 PM  Depression screen PHQ 2/9  Decreased Interest 3  Down, Depressed, Hopeless 2  PHQ - 2 Score 5  Altered sleeping 3  Tired, decreased energy 3  Change in appetite 2  Feeling bad or failure about yourself  0  Trouble concentrating 0  Moving slowly or fidgety/restless 1  Suicidal thoughts 0  PHQ-9 Score 14  Difficult doing work/chores Somewhat difficult      10/14/2021    3:21 PM  GAD 7 : Generalized Anxiety Score   Nervous, Anxious, on Edge 2  Control/stop worrying 3  Worry too much - different things 3  Trouble relaxing 3  Restless 0  Easily annoyed or irritable 3  Afraid - awful might happen 0  Total GAD 7 Score 14  Anxiety Difficulty Somewhat difficult     Assessment & Plan:   Problem List Items Addressed This Visit       Cardiovascular and Mediastinum   Essential hypertension - Primary    Chronic - used to be on meds in past, including Lasix, does not remember other names. Started on Amlodipine in ER & Lasix, ran out of Lasix. Refilling both meds, increasing Amlodipine to 10mg , f/u in 1 mo.      Relevant Medications   furosemide (LASIX) 20 MG tablet   amLODipine (NORVASC) 10 MG tablet     Other   Anxiety and depression    Chronic hx of taking Lexapro, no SE, reports worked well, no other meds tried or failed refilling Lexapro 10mg  both parents died in 02-Aug-2018 f/u 1 mo      Relevant Medications   escitalopram (LEXAPRO) 10 MG tablet   Morbidly obese (HCC)    Chronic  has appt next week to see [facility] weight loss clinic advised on not starting HCG hormone advised on wt loss strategies       Subjective:    Outpatient Medications Prior to Visit  Medication Sig Dispense Refill   Acetaminophen 500 MG capsule Take by mouth.     amLODipine (NORVASC) 5 MG tablet Take 1 tablet (5 mg total) by mouth daily. 30 tablet  0   furosemide (LASIX) 20 MG tablet Take 1 tablet (20 mg total) by mouth daily for 10 days. 10 tablet 0   phenazopyridine (PYRIDIUM) 200 MG tablet Take 1 tablet (200 mg total) by mouth 3 (three) times daily. 6 tablet 0   megestrol (MEGACE) 40 MG tablet Take 1 tablet (40 mg total) by mouth 2 (two) times daily. (Patient not taking: Reported on 10/14/2021) 60 tablet 0   No facility-administered medications prior to visit.   Past Medical History:  Diagnosis Date   Acute DVT (deep venous thrombosis) (HCC) 07/01/2013   Dysmenorrhea 09/29/2005   Formatting of this note  might be different from the original. spotting   Fatigue 09/03/2007   Hypertension    Long term (current) use of anticoagulants 07/05/2013   No past surgical history on file.  Objective:   Today's Vitals: BP (!) 170/98 (BP Location: Left Arm, Patient Position: Sitting, Cuff Size: Large)   Pulse 90   Temp 98 F (36.7 C) (Temporal)   Ht 5\' 7"  (1.702 m)   Wt (!) 328 lb 2 oz (148.8 kg)   LMP 10/01/2021 (Approximate)   SpO2 98%   BMI 51.39 kg/m   Physical Exam Vitals and nursing note reviewed.  Constitutional:      Appearance: Normal appearance. She is morbidly obese.  Cardiovascular:     Rate and Rhythm: Normal rate and regular rhythm.  Pulmonary:     Effort: Pulmonary effort is normal.     Breath sounds: Normal breath sounds.  Musculoskeletal:        General: Normal range of motion.  Skin:    General: Skin is warm and dry.  Neurological:     Mental Status: She is alert.  Psychiatric:        Mood and Affect: Mood normal.        Behavior: Behavior normal.    Meds ordered this encounter  Medications   furosemide (LASIX) 20 MG tablet    Sig: Take 1 tablet (20 mg total) by mouth daily.    Dispense:  90 tablet    Refill:  0   amLODipine (NORVASC) 10 MG tablet    Sig: Take 1 tablet (10 mg total) by mouth daily.    Dispense:  30 tablet    Refill:  2   DISCONTD: escitalopram (LEXAPRO) 10 MG tablet    Sig: Take 1 tablet (10 mg total) by mouth daily.    Dispense:  30 tablet    Refill:  1    Order Specific Question:   Supervising Provider    Answer:   ANDY, CAMILLE L [[id number]]   escitalopram (LEXAPRO) 10 MG tablet    Sig: Take 1 tablet (10 mg total) by mouth daily.    Dispense:  30 tablet    Refill:  1    Order Specific Question:   Supervising Provider    Answer:   ANDY, CAMILLE L [[id number]]    10/03/2021, NP

## 2021-10-14 NOTE — Assessment & Plan Note (Signed)
Chronic - used to be on meds in past, including Lasix, does not remember other names. Started on Amlodipine in ER & Lasix, ran out of Lasix. Refilling both meds, increasing Amlodipine to 10mg , f/u in 1 mo.

## 2021-10-14 NOTE — Patient Instructions (Signed)
Welcome to [facility], It was a pleasure meeting you today!    As discussed, I have sent your refills to your pharmacy.  Keep drinking 2-3L of water daily and reduce the sodium in your diet as much as possible.  Try to reduce your stress the best you can!  Relax on your days off, exercise when able, look at Yoga and Meditation videos on YouTube  Please schedule a 1 month follow up visit today.    PLEASE NOTE: If you had any LAB tests please let us know if you have not heard back within a few days. You may see your results on MyChart before we have a chance to review them but we will give you a call once they are reviewed by [place]. If we ordered any REFERRALS today, please let us know if you have not heard from their office within the next week.  Let us know through MyChart if you are needing REFILLS, or have your pharmacy send [place] the request. You can also use MyChart to communicate with me or any office staff.

## 2021-10-14 NOTE — Assessment & Plan Note (Signed)
   Chronic   has appt next week to see [facility] weight loss clinic  advised on not starting HCG hormone  advised on wt loss strategies

## 2021-10-15 ENCOUNTER — Encounter: Payer: Self-pay | Admitting: Obstetrics & Gynecology

## 2021-10-15 ENCOUNTER — Other Ambulatory Visit (HOSPITAL_COMMUNITY)
Admission: RE | Admit: 2021-10-15 | Discharge: 2021-10-15 | Disposition: A | Discharge status: Home / Self Care | Payer: BC Managed Care – PPO | Source: Ambulatory Visit | Attending: Obstetrics & Gynecology | Admitting: Obstetrics & Gynecology

## 2021-10-15 ENCOUNTER — Ambulatory Visit (INDEPENDENT_AMBULATORY_CARE_PROVIDER_SITE_OTHER): Payer: BC Managed Care – PPO | Admitting: Obstetrics & Gynecology

## 2021-10-15 VITALS — BP 172/93 | HR 99 | Wt 326.0 lb

## 2021-10-15 DIAGNOSIS — Z01419 Encounter for gynecological examination (general) (routine) without abnormal findings: Secondary | ICD-10-CM | POA: Insufficient documentation

## 2021-10-15 DIAGNOSIS — N924 Excessive bleeding in the premenopausal period: Secondary | ICD-10-CM

## 2021-10-15 DIAGNOSIS — N939 Abnormal uterine and vaginal bleeding, unspecified: Secondary | ICD-10-CM

## 2021-10-15 DIAGNOSIS — N840 Polyp of corpus uteri: Secondary | ICD-10-CM | POA: Insufficient documentation

## 2021-10-15 DIAGNOSIS — Z1231 Encounter for screening mammogram for malignant neoplasm of breast: Secondary | ICD-10-CM

## 2021-10-15 NOTE — Patient Instructions (Signed)
ENDOMETRIAL BIOPSY POST-PROCEDURE INSTRUCTIONS  You may take Ibuprofen, Aleve or Tylenol for pain if needed.  Cramping should resolve within in 24 hours.  You may have a small amount of spotting.  You should wear a mini pad for the next few days.  You may have intercourse after 24 hours.  You need to call if you have any pelvic pain, fever, heavy bleeding or foul smelling vaginal discharge.  Shower or bathe as normal  6. We will call you within one week with results and we will discuss the results at your follow-up appointment if needed.

## 2021-10-15 NOTE — Progress Notes (Signed)
GYNECOLOGY ANNUAL PREVENTATIVE CARE ENCOUNTER NOTE  History:     Emily Roy is a 46 y.o. [phone] female here for follow up after ED visit on 09/28/21 for AUB and for a routine annual gynecologic exam.  She was prescribed Megace 40 mg bid during that visit, ultrasound showed thickened endometrium at 19 mm, heterogenous with increased vascularity.  The plan was for her to follow up for endometrial biopsy.  Patient stopped taking the Megace about a week later, denies any further bleeding since then. She was worried because she has a history of DVT in 2009, and had superficial thrombophlebitis at IV site after that ED visit.  Patient has not seen a GYN provider since 2016.   Denies current abnormal vaginal bleeding, discharge, pelvic pain, problems with intercourse or other gynecologic concerns.    Gynecologic History Patient's last menstrual period was 10/01/2021 (approximate). Contraception: none Last Pap: 2016. Result was normal  Last Mammogram: around 2016.  Result was normal Last Colonoscopy: Never had one  Obstetric History OB History  Gravida Para Term Preterm AB Living  6 3 3   3 4   SAB IAB Ectopic Multiple Live Births    3   1 4     # Outcome Date GA Lbr Len/2nd Weight Sex Delivery Anes PTL Lv  6A Term 01/19/00     CS-LTranv   LIV  6B Term      CS-LTranv   LIV  5 Term 09/19/96     Vag-Spont   LIV  4 Term 01/23/95     Vag-Spont   LIV  3 IAB           2 IAB           1 IAB             Past Medical History:  Diagnosis Date   Acute DVT (deep venous thrombosis) (HCC) 07/01/2013   Dysmenorrhea 09/29/2005   Formatting of this note might be different from the original. spotting   Fatigue 09/03/2007   Hypertension    Long term (current) use of anticoagulants 07/05/2013    History reviewed. No pertinent surgical history.  Current Outpatient Medications on File Prior to Visit  Medication Sig Dispense Refill   Acetaminophen 500 MG capsule Take by mouth.     amLODipine  (NORVASC) 10 MG tablet Take 1 tablet (10 mg total) by mouth daily. 30 tablet 2   furosemide (LASIX) 20 MG tablet Take 1 tablet (20 mg total) by mouth daily. 90 tablet 0   escitalopram (LEXAPRO) 10 MG tablet Take 1 tablet (10 mg total) by mouth daily. (Patient not taking: Reported on 10/15/2021) 30 tablet 1   megestrol (MEGACE) 40 MG tablet Take 1 tablet (40 mg total) by mouth 2 (two) times daily. (Patient not taking: Reported on 10/14/2021) 60 tablet 0   No current facility-administered medications on file prior to visit.    No Known Allergies  Social History:  reports that she has never smoked. She has never used smokeless tobacco. She reports that she does not currently use drugs. She reports that she does not drink alcohol.  History reviewed. No pertinent family history.  The following portions of the patient's history were reviewed and updated as appropriate: allergies, current medications, past family history, past medical history, past social history, past surgical history and problem list.  Review of Systems Pertinent items noted in HPI and remainder of comprehensive ROS otherwise negative.  Physical Exam:  BP (!) 172/93  Pulse 99   Wt (!) 326 lb (147.9 kg)   LMP 10/01/2021 (Approximate)   BMI 51.06 kg/m  CONSTITUTIONAL: Well-developed, well-nourished female in no acute distress.  HENT:  Normocephalic, atraumatic, External right and left ear normal.  EYES: Conjunctivae and EOM are normal. Pupils are equal, round, and reactive to light. No scleral icterus.  NECK: Normal range of motion, supple, no masses.  Normal thyroid.  SKIN: Skin is warm and dry. No rash noted. Not diaphoretic. No erythema. No pallor. MUSCULOSKELETAL: Normal range of motion. No tenderness.  No cyanosis, clubbing, or edema. NEUROLOGIC: Alert and oriented to person, place, and time. Normal reflexes, muscle tone coordination.  PSYCHIATRIC: Normal mood and affect. Normal behavior. Normal judgment and thought  content. CARDIOVASCULAR: Normal heart rate noted, regular rhythm RESPIRATORY: Clear to auscultation bilaterally. Effort and breath sounds normal, no problems with respiration noted. BREASTS: Symmetric in size. No masses, tenderness, skin changes, nipple drainage, or lymphadenopathy bilaterally. Performed in the presence of a chaperone. ABDOMEN: Soft, obese, no distention noted.  No tenderness, rebound or guarding.  PELVIC: Normal appearing external genitalia and urethral meatus; normal appearing vaginal mucosa and cervix.  No abnormal vaginal discharge noted.  Pap smear obtained.  Normal uterine size, no other palpable masses, no uterine or adnexal tenderness.  Performed in the presence of a chaperone.  ENDOMETRIAL BIOPSY     The indications for endometrial biopsy were reviewed.   Risks of the biopsy including cramping, bleeding, infection, uterine perforation, inadequate specimen and need for additional procedures  were discussed. The patient states she understands and agrees to undergo procedure today. Consent was signed. Time out was performed. Recent urine HCG was negative. Chaperone was present during entire procedure. During the pelvic exam, the cervix was prepped with Betadine. A single-toothed tenaculum was placed on the anterior lip of the cervix to stabilize it. The 3 mm pipelle was introduced into the endometrial cavity without difficulty to a depth of 10.5 cm, and a moderate amount of tissue was obtained and sent to pathology. The instruments were removed from the patient's vagina. Minimal bleeding from the cervix was noted. The patient tolerated the procedure well. Routine post-procedure instructions were given to the patient.    Labs:    Latest Ref Rng & Units 09/28/2021    6:30 PM 09/23/2021    3:31 PM 09/22/2021    8:15 AM  CBC  WBC 4.0 - 10.5 K/uL 10.3  9.6  7.0   Hemoglobin 12.0 - 15.0 g/dL 91.6  38.4  66.5   Hematocrit 36.0 - 46.0 % 37.4  39.9  39.4   Platelets 150 - 400 K/uL 293   318  310     Imaging: 09/28/2021  TRANSABDOMINAL AND TRANSVAGINAL ULTRASOUND OF PELVIS CLINICAL DATA:  Menorrhagia  TECHNIQUE: Both transabdominal and transvaginal ultrasound examinations of the pelvis were performed. Transabdominal technique was performed for global imaging of the pelvis including uterus, ovaries, adnexal regions, and pelvic cul-de-sac. It was necessary to proceed with endovaginal exam following the transabdominal exam to visualize the endometrium and ovaries. COMPARISON:  None Available. FINDINGS: Uterus Measurements: 11.4 x 6 x 7.1 cm = volume: 252.6 mL. There is 1.4 x 1.3 cm hypoechoic structure in the anterior lower uterine segment, possibly fibroid. Endometrium Thickness: 1.9 cm. There is increased vascularity in the in the endometrium in the lower uterine segment. Right ovary Not optimally visualized.  No dominant adnexal masses are seen. Left ovary Not sonographically visualized. There are no dominant adnexal masses. Other findings No  abnormal free fluid. IMPRESSION: There is inhomogeneous echogenicity in myometrium. There is 1.4 cm hypoechoic structure in the anterior lower uterine segment suggesting possible fibroid. Endometrial stripe is prominent measuring up to 1.9 cm. There is increased vascularity in the endometrium in the lower uterine segment. Short-term follow-up pelvic sonogram in the 1-2 months along with sonohysterogram as warranted should be considered. Ovaries are not adequately visualized for evaluation. As far as seen, there are no dominant adnexal masses. Electronically Signed   By: Ernie Avena M.D.   On: 09/28/2021 20:08    Assessment and Plan:      1. Abnormal uterine bleeding 2. Morbid obesity (HCC) Discussed etiologies of perimenopausal abnormal uterine bleeding, concern about precancerous/hyperplasia or cancerous etiology (5 to 10% percent of cases). Also discussed role of unopposed estrogen exposure in leading to thickened or proliferative  endometrium; and its possible correlation with endometrial hyperplasia/carcinoma.  Discussed that morbid obesity is linked to endometrial pathology given that adipose cells produce extra estrogen (estrone) which can cause the endometrium to have a significant amount of estrogen exposure.  The primary goal in the diagnostic evaluation of women with abnormal uterine bleeding is to exclude abnormal cells; this is done via endometrial biopsy and pap smear.   Further diagnostic evaluation is indicated for recurrent or persistent bleeding. She will return to discuss results and further management. Reassured that she can use progestin therapy as prescribed, with her history of DVT and superficial thrombophlebitis. The low risk of thrombosis is outweighed by the benefit of treating AUB. - Surgical pathology - Cytology - PAP  3. Breast cancer screening by mammogram Mammogram scheduled - MM 3D SCREEN BREAST BILATERAL; Future  4. Well woman exam with routine gynecological exam - Cytology - PAP Will follow up results of pap smear and manage accordingly. Colon cancer screening is being arranged by PCP. Routine preventative health maintenance measures emphasized. Please refer to After Visit Summary for other counseling recommendations.      Jaynie Collins, MD, FACOG Obstetrician & Gynecologist, [facility] for [employer], [facility]

## 2021-10-18 DIAGNOSIS — E559 Vitamin D deficiency, unspecified: Secondary | ICD-10-CM | POA: Diagnosis not present

## 2021-10-18 DIAGNOSIS — R7989 Other specified abnormal findings of blood chemistry: Secondary | ICD-10-CM | POA: Diagnosis not present

## 2021-10-18 DIAGNOSIS — N951 Menopausal and female climacteric states: Secondary | ICD-10-CM | POA: Diagnosis not present

## 2021-10-18 DIAGNOSIS — R7303 Prediabetes: Secondary | ICD-10-CM | POA: Diagnosis not present

## 2021-10-18 DIAGNOSIS — Z6841 Body Mass Index (BMI) 40.0 and over, adult: Secondary | ICD-10-CM | POA: Diagnosis not present

## 2021-10-18 DIAGNOSIS — R635 Abnormal weight gain: Secondary | ICD-10-CM | POA: Diagnosis not present

## 2021-10-18 DIAGNOSIS — E785 Hyperlipidemia, unspecified: Secondary | ICD-10-CM | POA: Diagnosis not present

## 2021-10-18 LAB — SURGICAL PATHOLOGY

## 2021-10-19 ENCOUNTER — Other Ambulatory Visit: Payer: Self-pay | Admitting: Family

## 2021-10-19 DIAGNOSIS — F419 Anxiety disorder, unspecified: Secondary | ICD-10-CM

## 2021-10-21 ENCOUNTER — Ambulatory Visit: Payer: BC Managed Care – PPO

## 2021-10-21 LAB — CYTOLOGY - PAP
Comment: NEGATIVE
Diagnosis: NEGATIVE
High risk HPV: NEGATIVE

## 2021-10-25 DIAGNOSIS — Z1339 Encounter for screening examination for other mental health and behavioral disorders: Secondary | ICD-10-CM | POA: Diagnosis not present

## 2021-10-25 DIAGNOSIS — Z1331 Encounter for screening for depression: Secondary | ICD-10-CM | POA: Diagnosis not present

## 2021-10-25 DIAGNOSIS — R635 Abnormal weight gain: Secondary | ICD-10-CM | POA: Diagnosis not present

## 2021-10-25 DIAGNOSIS — Z6841 Body Mass Index (BMI) 40.0 and over, adult: Secondary | ICD-10-CM | POA: Diagnosis not present

## 2021-10-25 DIAGNOSIS — N951 Menopausal and female climacteric states: Secondary | ICD-10-CM | POA: Diagnosis not present

## 2021-10-25 DIAGNOSIS — I10 Essential (primary) hypertension: Secondary | ICD-10-CM | POA: Diagnosis not present

## 2021-10-26 NOTE — Progress Notes (Signed)
TC to pt to inform of benign results. No answer. No VM available. MyChart message sent.

## 2021-11-01 ENCOUNTER — Ambulatory Visit: Payer: BC Managed Care – PPO | Admitting: Obstetrics & Gynecology

## 2021-11-11 ENCOUNTER — Encounter: Payer: Self-pay | Admitting: Family

## 2021-11-11 ENCOUNTER — Ambulatory Visit: Payer: BC Managed Care – PPO | Admitting: Family

## 2021-11-11 VITALS — BP 143/95 | HR 103 | Temp 98.2°F | Ht 67.0 in | Wt 329.5 lb

## 2021-11-11 DIAGNOSIS — I10 Essential (primary) hypertension: Secondary | ICD-10-CM | POA: Diagnosis not present

## 2021-11-11 DIAGNOSIS — Z1211 Encounter for screening for malignant neoplasm of colon: Secondary | ICD-10-CM

## 2021-11-11 MED ORDER — OLMESARTAN MEDOXOMIL 20 MG PO TABS: 20.0000 mg | ORAL_TABLET | Freq: Every day | ORAL | 2 refills | Status: DC

## 2021-11-11 NOTE — Patient Instructions (Addendum)
It was very nice to see you today!   I will review your lab results via MyChart in a few days.  I have sent Olmesartan, a blood pressure medicine to your pharmacy, start this tomorrow.  When I review your lab result I will discuss increasing the Lasix for a few days.  It is ok to add herbs or supplements to your medications. But start with one at a time  for 1-2 weeks to rule out any possible side effects you may have with them.   Magnesium Glycinate or L-Threonate (Magtein) - up to 2,000mg /day in divided doses- are easier on the bowels vs. Mag. Citrate or Oxide. This can  help with lowering blood pressure, anxiety, muscle recovery (leg cramps), improved brain function, and if taken before bedtime can help with maintaining your sleep (not falling asleep). Look for chelated form (better absorbability) and either organic or have a 3rd party seal from NSF international, [employer] or USP. This authenticates the quality but not the efficacy (since not FDA approved).       PLEASE NOTE:  If you had any lab tests please let us know if you have not heard back within a few days. You may see your results on MyChart before we have a chance to review them but we will give you a call once they are reviewed by [place]. If we ordered any referrals today, please let us know if you have not heard from their office within the next week.

## 2021-11-11 NOTE — Assessment & Plan Note (Addendum)
   chronic  increased Amlodipine to 10mg , continued Lasix 20mg  qd last visit  BP better but not at goal and leg swelling no better back on Lasix, prob d/t increase in Amlodipine; still having sharp pains in neck going around to back of head, but pain better than before  Adding Olmesartan 20mg  qd, checking BMP, if ok will increase Lasix to 40mg  x 3d to get fluid off  may switch to HCTZ - pt remembers being on Maxide in past, but then Lasix added d/t continued edema  f/u 1 month

## 2021-11-11 NOTE — Progress Notes (Signed)
Patient ID: Emily Roy, female    DOB: [birth year], 46 y.o.   MRN: [id number]  Chief Complaint  Patient presents with   Hypertension   Follow-up    Pt states she is still having swelling In both legs.     HPI: Hypertension: Patient is currently maintained on the following medications for blood pressure: Amlodipine, Lasix Patient reports good compliance with blood pressure medications. Patient denies chest pain, shortness of breath.  Still having HA, but better; LLE is no different back on Lasix. Last 3 blood pressure readings in our office are as follows: BP Readings from Last 3 Encounters:  11/11/21 (!) 143/95  10/15/21 (!) 172/93  10/14/21 (!) 170/98    Assessment & Plan:   Problem List Items Addressed This Visit       Cardiovascular and Mediastinum   Essential hypertension - Primary    chronic increased Amlodipine to 10mg , continued Lasix 20mg  qd last visit BP better but not at goal and leg swelling no better back on Lasix, prob d/t increase in Amlodipine; still having sharp pains in neck going around to back of head, but pain better than before Adding Olmesartan 20mg  qd, checking BMP, if ok will increase Lasix to 40mg  x 3d to get fluid off may switch to HCTZ - pt remembers being on Maxide in past, but then Lasix added d/t continued edema f/u 1 month      Relevant Medications   olmesartan (BENICAR) 20 MG tablet   Other Relevant Orders   Basic Metabolic Panel (BMET)   Other Visit Diagnoses     Encounter for screening colonoscopy       Relevant Orders   Ambulatory referral to Gastroenterology       Subjective:    Outpatient Medications Prior to Visit  Medication Sig Dispense Refill   Acetaminophen 500 MG capsule Take by mouth.     amLODipine (NORVASC) 10 MG tablet Take 1 tablet (10 mg total) by mouth daily. 30 tablet 2   escitalopram (LEXAPRO) 10 MG tablet Take 1 tablet (10 mg total) by mouth daily. 30 tablet 1   furosemide (LASIX) 20 MG tablet  Take 1 tablet (20 mg total) by mouth daily. 90 tablet 0   No facility-administered medications prior to visit.   Past Medical History:  Diagnosis Date   Acute DVT (deep venous thrombosis) (HCC) 07/01/2013   Dysmenorrhea 09/29/2005   Formatting of this note might be different from the original. spotting   Fatigue 09/03/2007   Hypertension    Long term (current) use of anticoagulants 07/05/2013   No past surgical history on file. No Known Allergies    Objective:    Physical Exam Vitals and nursing note reviewed.  Constitutional:      Appearance: Normal appearance. She is obese.  Cardiovascular:     Rate and Rhythm: Normal rate and regular rhythm.  Pulmonary:     Effort: Pulmonary effort is normal.     Breath sounds: Normal breath sounds.  Musculoskeletal:        General: Normal range of motion.  Skin:    General: Skin is warm and dry.  Neurological:     Mental Status: She is alert.  Psychiatric:        Mood and Affect: Mood normal.        Behavior: Behavior normal.    BP (!) 143/95 (BP Location: Left Arm, Patient Position: Sitting, Cuff Size: Large)   Pulse (!) 103   Temp 98.2 F (36.8 C) (Temporal)  Ht 5\' 7"  (1.702 m)   Wt (!) 329 lb 8 oz (149.5 kg)   LMP 10/01/2021 (Approximate)   SpO2 95%   BMI 51.61 kg/m  Wt Readings from Last 3 Encounters:  11/11/21 (!) 329 lb 8 oz (149.5 kg)  10/15/21 (!) 326 lb (147.9 kg)  10/14/21 (!) 328 lb 2 oz (148.8 kg)       10/16/21, NP

## 2021-11-12 LAB — BASIC METABOLIC PANEL
BUN: 18 mg/dL (ref 6–23)
CO2: 25 mEq/L (ref 19–32)
Calcium: 9 mg/dL (ref 8.4–10.5)
Chloride: 106 mEq/L (ref 96–112)
Creatinine, Ser: 0.89 mg/dL (ref 0.40–1.20)
GFR: 77.2 mL/min (ref >60.00)
Glucose, Bld: 82 mg/dL (ref 70–99)
Potassium: 3.7 mEq/L (ref 3.5–5.1)
Sodium: 138 mEq/L (ref 135–145)

## 2021-11-13 NOTE — Progress Notes (Signed)
Your electrolytes and kidney function look good.   For your leg swelling, go ahead and increase your Lasix to 40mg  for 3-4 days and then reduce back to 20mg  daily.  Your potassium is normal, but on the low end, and as we discussed, while taking the extra Lasix, you should eat potassium rich foods, and actually, there are other foods with more potassium per serving than bananas, if that is more appealing:  Highest content (>25 mEq/100 g): Dried figs, Molasses, Seaweed  Very high content (>12.5 mEq/100 g): Dried fruits (dates, prunes), Nuts, Avocados, Bran cereals, Wheat germ, Lima beans

## 2021-12-09 ENCOUNTER — Ambulatory Visit: Payer: BC Managed Care – PPO | Admitting: Family

## 2021-12-09 ENCOUNTER — Encounter: Payer: Self-pay | Admitting: Family

## 2021-12-09 VITALS — BP 160/98 | HR 108 | Temp 98.1°F | Ht 67.0 in | Wt 324.0 lb

## 2021-12-09 DIAGNOSIS — R053 Chronic cough: Secondary | ICD-10-CM

## 2021-12-09 DIAGNOSIS — I10 Essential (primary) hypertension: Secondary | ICD-10-CM

## 2021-12-09 DIAGNOSIS — I83893 Varicose veins of bilateral lower extremities with other complications: Secondary | ICD-10-CM

## 2021-12-09 MED ORDER — AZITHROMYCIN 250 MG PO TABS: ORAL_TABLET | ORAL | 0 refills | Status: AC

## 2021-12-09 MED ORDER — METOPROLOL SUCCINATE ER 25 MG PO TB24: 25.0000 mg | ORAL_TABLET | Freq: Every day | ORAL | 5 refills | Status: DC

## 2021-12-09 NOTE — Progress Notes (Signed)
Patient ID: Emily Roy, female    DOB: [birth year], 46 y.o.   MRN: [id number]  Chief Complaint  Patient presents with   Hypertension    HPI: Hypertension: Patient is currently maintained on the following medications for blood pressure: Amlodipine, Lasix, never started Olmesartan- told it was too expensive by pharmacy? Failed meds include: none Patient reports fair compliance with blood pressure medications. Patient denies chest pain, headaches, shortness of breath. Pt has moderate swelling in both legs with varicose veins & spider veins. Last 3 blood pressure readings in our office are as follows: BP Readings from Last 3 Encounters:  12/09/21 (!) 160/98  11/11/21 (!) 143/95  10/15/21 (!) 172/93   Persistent cough - pt reports having covid a week ago, did not seek medical care, feeling better overall but having persistent, painful cough with productive dark green sputum & SOB with exertion. Denies fever, sore throat or other sinus sx.  Assessment & Plan:   Problem List Items Addressed This Visit       Cardiovascular and Mediastinum   Essential hypertension - Primary    chronic taking Amlodipine 10mg   did not start Olmesartan due to cost - called pharmacy & they did not have her ins. info - also gave pt GoodRX coupon just in case still $ taking Lasix 20mg  qd and 40mg  on days of increased swelling starting Metoprolol qhs - pt remembers being on this in past, caused her to feel tired - advised to start 1/2 pill for a few nights, if ok increase to full pill f/u in 1 month      Relevant Medications   metoprolol succinate (TOPROL-XL) 25 MG 24 hr tablet   Varicose veins of both legs with edema    chronic pt is morbidly obese works full time - alternates b/w walking and sitting taking Lasix 20mg  qd with mild relief, increases to 40mg  x3d about every other week advised on wearing mild compression socks up to knees will continue to monitor      Relevant  Medications   metoprolol succinate (TOPROL-XL) 25 MG 24 hr tablet   Other Visit Diagnoses     Persistent cough    - post covid cough, advised on 2L water qd, cough drop suppressant. Sending Zpack, pt advised on use & SE.    Relevant Medications   azithromycin (ZITHROMAX) 250 MG tablet      Subjective:    Outpatient Medications Prior to Visit  Medication Sig Dispense Refill   amLODipine (NORVASC) 10 MG tablet Take 1 tablet (10 mg total) by mouth daily. 30 tablet 2   escitalopram (LEXAPRO) 10 MG tablet Take 1 tablet (10 mg total) by mouth daily. 30 tablet 1   furosemide (LASIX) 20 MG tablet Take 1 tablet (20 mg total) by mouth daily. 90 tablet 0   olmesartan (BENICAR) 20 MG tablet Take 1 tablet (20 mg total) by mouth daily. 30 tablet 2   Acetaminophen 500 MG capsule Take by mouth. (Patient not taking: Reported on 12/09/2021)     No facility-administered medications prior to visit.   Past Medical History:  Diagnosis Date   Acute DVT (deep venous thrombosis) (HCC) 07/01/2013   Dysmenorrhea 09/29/2005   Formatting of this note might be different from the original. spotting   Fatigue 09/03/2007   Hypertension    Long term (current) use of anticoagulants 07/05/2013   History reviewed. No pertinent surgical history. No Known Allergies    Objective:    Physical Exam Vitals and nursing  note reviewed.  Constitutional:      Appearance: Normal appearance. She is morbidly obese.  Cardiovascular:     Rate and Rhythm: Normal rate and regular rhythm.     Comments: pt has several varicose veins on bilateral legs Pulmonary:     Effort: Pulmonary effort is normal.     Breath sounds: Normal breath sounds.  Musculoskeletal:        General: Normal range of motion.     Right lower leg: Edema present.     Left lower leg: Edema present.  Skin:    General: Skin is warm and dry.  Neurological:     Mental Status: She is alert.  Psychiatric:        Mood and Affect: Mood normal.        Behavior:  Behavior normal.    BP (!) 160/98   Pulse (!) 108   Temp 98.1 F (36.7 C)   Ht 5\' 7"  (1.702 m)   Wt (!) 324 lb (147 kg)   LMP 12/09/2021 (Exact Date) Comment: started on 12/09/21  SpO2 95%   BMI 50.75 kg/m  Wt Readings from Last 3 Encounters:  12/09/21 (!) 324 lb (147 kg)  11/11/21 (!) 329 lb 8 oz (149.5 kg)  10/15/21 (!) 326 lb (147.9 kg)       10/17/21, NP

## 2021-12-09 NOTE — Assessment & Plan Note (Addendum)
   chronic  taking Amlodipine 10mg    did not start Olmesartan due to cost - called pharmacy & they did not have her ins. info - also gave pt GoodRX coupon just in case still $  taking Lasix 20mg  qd and 40mg  on days of increased swelling  starting Metoprolol qhs - pt remembers being on this in past, caused her to feel tired - advised to start 1/2 pill for a few nights, if ok increase to full pill  f/u in 1 month

## 2021-12-09 NOTE — Patient Instructions (Signed)
It was very nice to see you today!   I have sent the new med Metoprolol to [facility] - start 1/2 pill at bedtime for a few nights, if tolerated, increase to 1 full pill nightly.  I have sent a new RX for the Olmesartan to [facility] in case [facility] still quotes you a higher price after running your insurance. Use the GoodRX coupon at [facility] for the this med.  Keep taking the Amlodipine along with the Olmesartan every morning.  Continue the 20mg  Lasix every morning and increase to 2 pills for a few days as needed, then reduce to 20mg  daily.  Look for MILD compression socks that will fit your legs to wear during the day, take off at night. This will help decrease your varicose veins, prevent new ones from forming and reduce the swelling in your legs.  Follow up 1 month!    PLEASE NOTE:  If you had any lab tests please let know if you have not heard back within a few days. You may see your results on MyChart before we have a chance to review them but we will give you a call once they are reviewed by . If we ordered any referrals today, please let us know if you have not heard from their office within the next week.

## 2021-12-10 DIAGNOSIS — I83893 Varicose veins of bilateral lower extremities with other complications: Secondary | ICD-10-CM | POA: Insufficient documentation

## 2021-12-10 NOTE — Assessment & Plan Note (Signed)
   chronic  pt is morbidly obese  works Office manager full time - alternates b/w walking and sitting  taking Lasix 20mg  qd with mild relief, increases to 40mg  x3d about every other week  advised on wearing mild compression socks up to knees  will continue to monitor

## 2022-01-03 ENCOUNTER — Other Ambulatory Visit: Payer: Self-pay | Admitting: Family

## 2022-01-03 DIAGNOSIS — I10 Essential (primary) hypertension: Secondary | ICD-10-CM

## 2022-01-07 ENCOUNTER — Ambulatory Visit: Payer: BC Managed Care – PPO | Admitting: Family

## 2022-01-10 ENCOUNTER — Other Ambulatory Visit: Payer: Self-pay

## 2022-01-10 ENCOUNTER — Encounter: Payer: Self-pay | Admitting: Family

## 2022-01-10 ENCOUNTER — Ambulatory Visit: Payer: BC Managed Care – PPO | Admitting: Family

## 2022-01-10 VITALS — BP 146/100 | HR 76 | Temp 98.2°F | Ht 67.0 in | Wt 328.0 lb

## 2022-01-10 DIAGNOSIS — R519 Headache, unspecified: Secondary | ICD-10-CM | POA: Insufficient documentation

## 2022-01-10 DIAGNOSIS — I10 Essential (primary) hypertension: Secondary | ICD-10-CM | POA: Diagnosis not present

## 2022-01-10 HISTORY — DX: Headache, unspecified: R51.9

## 2022-01-10 MED ORDER — OLMESARTAN MEDOXOMIL 20 MG PO TABS: 40.0000 mg | ORAL_TABLET | Freq: Every day | ORAL | 2 refills | Status: DC

## 2022-01-10 NOTE — Assessment & Plan Note (Addendum)
.   chronic - Not at goal . taking Amlodipine 10mg  qhs, Metoprolol 25mg  ER, Olmesartan 20mg  in the morning  . taking Lasix 20mg  qd and 40mg  on days of increased swelling . BP better but still not at goal . Increasing Olmesartan to 40mg  qd, or 20mg  bid . f/u in 1 month, nurse visit

## 2022-01-10 NOTE — Patient Instructions (Addendum)
It was very nice to see you today!   Your blood pressure is better! But we still have a little work to do:  Increase the Olmesartan to 40mg  daily. You can take 2 pills of your 20mg  together, or split up and take 1 pill twice a day. Continue the Metoprolol, Lasix, and Amlodipine daily as you have been.  Continue to hydrate with 2 liters of water every day! Eat low sodium diet.  For sleep take a 3mg  dose of over the counter Melatonin and let me know if this works better & not making you so drowsy. I think the lack of sleep and your still high blood pressure are reasons for your headache. You can send me a MyChart message or call the office if your headaches are still not better.   Take Excedrin migraine instead of the goody powders for your headache.  Schedule a 1 month NURSE only follow up visit to check your blood pressure.       PLEASE NOTE:  If you had any lab tests please let us know if you have not heard back within a few days. You may see your results on MyChart before we have a chance to review them but we will give you a call once they are reviewed by [place]. If we ordered any referrals today, please let us know if you have not heard from their office within the next week.

## 2022-01-10 NOTE — Assessment & Plan Note (Addendum)
   New   believe mostly r/t high BP  pt reports no HA since last visit 1 month ago and starting the Olmesartan, but then she started having sharp pains in top of her head last few days, relieved with goody powders.   pt also reports continued stress at work and not sleeping well - she has taken Melatonin 10mg  but made her too drowsy next day, advised to take just 3mg  dose and see how this works - works better for people like her trying to sleep during the day  Increasing Olmesartan dose today  advised to take OTC Excedrin migraine prn instead of Goody powders   f/u 1 month

## 2022-01-10 NOTE — Progress Notes (Signed)
Patient ID: Emily Roy, female    DOB: 13-Feb-1976, 46 y.o.   MRN: [id number]  Chief Complaint  Patient presents with   Hypertension    Follow up   Headache    Pt c/o sharp headaches for the past 5 days. Pt states it is on and off throughout the day. Has tried good powder which does help it some.     HPI: Hypertension: Patient is currently maintained on the following medications for blood pressure: Amlodipine, Lasix, Olmesartan, Toprol Failed meds include: none Patient reports fair compliance with blood pressure medications. Patient denies chest pain,  shortness of breath. Pt has moderate swelling in both legs with varicose veins & spider veins, also reporting frequent headaches. Pt did not pick up the compression socks.   Persistent headaches:  describes ice-pick type headaches on top of head last few days, had been having sharp pains in back of her head, but has not had since last appointment and then restarted a few days ago in different location. Pt reports Goody powders helps pain.   Assessment & Plan:   Problem List Items Addressed This Visit       Cardiovascular and Mediastinum   Essential hypertension - Primary    chronic - Not at goal taking Amlodipine 10mg  qhs, Metoprolol 25mg  ER, Olmesartan 20mg  in the morning  taking Lasix 20mg  qd and 40mg  on days of increased swelling BP better but still not at goal Increasing Olmesartan to 40mg  qd, or 20mg  bid f/u in 1 month, nurse visit      Relevant Medications   olmesartan (BENICAR) 20 MG tablet     Other   Persistent headaches    New  believe mostly r/t high BP pt reports no HA since last visit 1 month ago and starting the Olmesartan, but then she started having sharp pains in top of her head last few days, relieved with goody powders.  pt also reports continued stress at work and not sleeping well - she has taken Melatonin 10mg  but made her too drowsy next day, advised to take just 3mg  dose and see how this  works - works better for people like her trying to sleep during the day Increasing Olmesartan dose today advised to take OTC Excedrin migraine prn instead of Goody powders  f/u 1 month       Subjective:    Outpatient Medications Prior to Visit  Medication Sig Dispense Refill   amLODipine (NORVASC) 10 MG tablet TAKE 1 TABLET BY MOUTH EVERY DAY 30 tablet 0   escitalopram (LEXAPRO) 10 MG tablet Take 1 tablet (10 mg total) by mouth daily. 30 tablet 1   furosemide (LASIX) 20 MG tablet TAKE 1 TABLET BY MOUTH EVERY DAY 30 tablet 0   metoprolol succinate (TOPROL-XL) 25 MG 24 hr tablet Take 1 tablet (25 mg total) by mouth at bedtime. OK to start with 1/2 pill for a few nights, then go to a full pill if tolerated. 30 tablet 5   olmesartan (BENICAR) 20 MG tablet Take 1 tablet (20 mg total) by mouth daily. 30 tablet 2   No facility-administered medications prior to visit.   Past Medical History:  Diagnosis Date   Acute DVT (deep venous thrombosis) (HCC) 07/01/2013   Dysmenorrhea 09/29/2005   Formatting of this note might be different from the original. spotting   Fatigue 09/03/2007   Hypertension    Long term (current) use of anticoagulants 07/05/2013   No past surgical history on file. No Known Allergies  Objective:    Physical Exam Vitals and nursing note reviewed.  Constitutional:      Appearance: Normal appearance. She is morbidly obese.  Cardiovascular:     Rate and Rhythm: Normal rate and regular rhythm.     Comments: pt has several varicose veins on bilateral legs Pulmonary:     Effort: Pulmonary effort is normal.     Breath sounds: Normal breath sounds.  Musculoskeletal:        General: Normal range of motion.     Right lower leg: Edema present.     Left lower leg: Edema present.  Skin:    General: Skin is warm and dry.  Neurological:     Mental Status: She is alert.  Psychiatric:        Mood and Affect: Mood normal.        Behavior: Behavior normal.    BP (!) 146/100  (BP Location: Left Arm, Patient Position: Sitting, Cuff Size: Large)   Pulse 76   Temp 98.2 F (36.8 C) (Temporal)   Ht 5\' 7"  (1.702 m)   Wt (!) 328 lb (148.8 kg)   LMP 01/05/2022 (Exact Date)   SpO2 98%   BMI 51.37 kg/m  Wt Readings from Last 3 Encounters:  01/10/22 (!) 328 lb (148.8 kg)  12/09/21 (!) 324 lb (147 kg)  11/11/21 (!) 329 lb 8 oz (149.5 kg)       Jeanie Sewer, NP

## 2022-01-17 ENCOUNTER — Other Ambulatory Visit: Payer: Self-pay | Admitting: Family

## 2022-01-17 DIAGNOSIS — I10 Essential (primary) hypertension: Secondary | ICD-10-CM

## 2022-02-10 ENCOUNTER — Ambulatory Visit: Payer: BC Managed Care – PPO

## 2022-02-17 ENCOUNTER — Other Ambulatory Visit: Payer: Self-pay | Admitting: Family

## 2022-02-17 DIAGNOSIS — I10 Essential (primary) hypertension: Secondary | ICD-10-CM

## 2022-04-23 ENCOUNTER — Other Ambulatory Visit: Payer: Self-pay | Admitting: Family

## 2022-04-23 DIAGNOSIS — I10 Essential (primary) hypertension: Secondary | ICD-10-CM

## 2022-07-08 ENCOUNTER — Encounter (HOSPITAL_BASED_OUTPATIENT_CLINIC_OR_DEPARTMENT_OTHER): Payer: Self-pay | Admitting: Emergency Medicine

## 2022-07-08 ENCOUNTER — Other Ambulatory Visit: Payer: Self-pay

## 2022-07-08 ENCOUNTER — Emergency Department (HOSPITAL_BASED_OUTPATIENT_CLINIC_OR_DEPARTMENT_OTHER)
Admission: EM | Admit: 2022-07-08 | Discharge: 2022-07-08 | Disposition: A | Discharge status: Home / Self Care | Payer: BC Managed Care – PPO | Attending: Emergency Medicine | Admitting: Emergency Medicine

## 2022-07-08 ENCOUNTER — Other Ambulatory Visit: Payer: Self-pay | Admitting: Family

## 2022-07-08 DIAGNOSIS — R600 Localized edema: Secondary | ICD-10-CM | POA: Insufficient documentation

## 2022-07-08 DIAGNOSIS — R42 Dizziness and giddiness: Secondary | ICD-10-CM | POA: Diagnosis not present

## 2022-07-08 DIAGNOSIS — R2233 Localized swelling, mass and lump, upper limb, bilateral: Secondary | ICD-10-CM | POA: Insufficient documentation

## 2022-07-08 DIAGNOSIS — I10 Essential (primary) hypertension: Secondary | ICD-10-CM | POA: Diagnosis not present

## 2022-07-08 DIAGNOSIS — R2243 Localized swelling, mass and lump, lower limb, bilateral: Secondary | ICD-10-CM | POA: Insufficient documentation

## 2022-07-08 DIAGNOSIS — Z79899 Other long term (current) drug therapy: Secondary | ICD-10-CM | POA: Insufficient documentation

## 2022-07-08 LAB — BASIC METABOLIC PANEL
Anion gap: 11 (ref 5–15)
BUN: 14 mg/dL (ref 6–20)
CO2: 23 mmol/L (ref 22–32)
Calcium: 9.5 mg/dL (ref 8.9–10.3)
Chloride: 105 mmol/L (ref 98–111)
Creatinine, Ser: 0.71 mg/dL (ref 0.44–1.00)
GFR, Estimated: >60 mL/min (ref >60)
Glucose, Bld: 144 mg/dL — ABNORMAL HIGH (ref 70–99)
Potassium: 3.6 mmol/L (ref 3.5–5.1)
Sodium: 139 mmol/L (ref 135–145)

## 2022-07-08 LAB — HEPATIC FUNCTION PANEL
ALT: 14 U/L (ref 0–44)
AST: 13 U/L — ABNORMAL LOW (ref 15–41)
Albumin: 4 g/dL (ref 3.5–5.0)
Alkaline Phosphatase: 58 U/L (ref 38–126)
Bilirubin, Direct: 0.1 mg/dL (ref 0.0–0.2)
Indirect Bilirubin: 0.3 mg/dL (ref 0.3–0.9)
Total Bilirubin: 0.4 mg/dL (ref 0.3–1.2)
Total Protein: 7.1 g/dL (ref 6.5–8.1)

## 2022-07-08 LAB — CBC
HCT: 35.3 % — ABNORMAL LOW (ref 36.0–46.0)
Hemoglobin: 10.9 g/dL — ABNORMAL LOW (ref 12.0–15.0)
MCH: 23.8 pg — ABNORMAL LOW (ref 26.0–34.0)
MCHC: 30.9 g/dL (ref 30.0–36.0)
MCV: 77.1 fL — ABNORMAL LOW (ref 80.0–100.0)
Platelets: 282 10*3/uL (ref 150–400)
RBC: 4.58 MIL/uL (ref 3.87–5.11)
RDW: 16.5 % — ABNORMAL HIGH (ref 11.5–15.5)
WBC: 10 10*3/uL (ref 4.0–10.5)
nRBC: 0 % (ref 0.0–0.2)

## 2022-07-08 NOTE — ED Provider Notes (Signed)
[place] [facility]   CSN: [id number] Arrival date & time: 07/08/22  1239     History  Chief Complaint  Patient presents with   Hypertension    Emily Roy is a 47 y.o. female.  HPI     47 year old patient comes in with chief complaint of elevated blood pressure.  Patient indicates that she has not taken her BP medications over the last 2 weeks.  She has run out of her BP med over a period of several weeks, but has not had access to any BP meds over the last 2 weeks.  Today while at work, her blood pressure was checked and it was noted to be 220 systolic, and she was advised to come to the ER.  Patient denies any chest pain, shortness of breath, severe headache.  She has noted some episodes of lightheadedness and occasionally she will have weight discomfort over her head, as if there is some liquid pouring down.  There is no family history of brain aneurysm and patient denies any severe headache, neck pain.   Patient has a PCP.  She states that when she has her medication, she is regular with that and her BP normally runs in the 140s or 150s systolic.  She also reports increased swelling in her legs and arm.  She also has seen some skin discoloration to her forearm, that is better when she started taking amlodipine.  Home Medications Prior to Admission medications   Medication Sig Start Date End Date Taking? Authorizing Provider  amLODipine (NORVASC) 10 MG tablet TAKE 1 TABLET BY MOUTH EVERY DAY 02/17/22   Dulce SellarHudnell, Stephanie, NP  escitalopram (LEXAPRO) 10 MG tablet Take 1 tablet (10 mg total) by mouth daily. 10/14/21   Dulce SellarHudnell, Stephanie, NP  furosemide (LASIX) 20 MG tablet TAKE 1 TABLET BY MOUTH EVERY DAY 04/25/22   Dulce SellarHudnell, Stephanie, NP  metoprolol succinate (TOPROL-XL) 25 MG 24 hr tablet Take 1 tablet (25 mg total) by mouth at bedtime. OK to start with 1/2 pill for a few nights, then go to a full pill if tolerated.  12/09/21   Dulce SellarHudnell, Stephanie, NP  olmesartan (BENICAR) 20 MG tablet TAKE 1 TABLET BY MOUTH EVERY DAY 07/08/22   Dulce SellarHudnell, Stephanie, NP      Allergies    Patient has no known allergies.    Review of Systems   Review of Systems  All other systems reviewed and are negative.   Physical Exam Updated Vital Signs BP (!) 177/115   Pulse (!) 107   Temp 99.2 F (37.3 C) (Oral)   Resp 16   Ht 5\' 8"  (1.727 m)   Wt (!) 147.4 kg   SpO2 99%   BMI 49.42 kg/m  Physical Exam Vitals and nursing note reviewed.  Constitutional:      Appearance: She is well-developed.  HENT:     Head: Normocephalic and atraumatic.  Eyes:     Extraocular Movements: Extraocular movements intact.  Cardiovascular:     Rate and Rhythm: Normal rate.  Pulmonary:     Effort: Pulmonary effort is normal.  Musculoskeletal:     Cervical back: Normal range of motion and neck supple.     Right lower leg: Edema present.     Left lower leg: Edema present.  Skin:    General: Skin is dry.     Comments: Anicteric sclera, however patient does have slight yellowing of dorsal aspect of her forearm only  Neurological:  Mental Status: She is alert and oriented to person, place, and time.     ED Results / Procedures / Treatments   Labs (all labs ordered are listed, but only abnormal results are displayed) Labs Reviewed  CBC - Abnormal; Notable for the following components:      Result Value   Hemoglobin 10.9 (*)    HCT 35.3 (*)    MCV 77.1 (*)    MCH 23.8 (*)    RDW 16.5 (*)    All other components within normal limits  BASIC METABOLIC PANEL - Abnormal; Notable for the following components:   Glucose, Bld 144 (*)    All other components within normal limits  HEPATIC FUNCTION PANEL - Abnormal; Notable for the following components:   AST 13 (*)    All other components within normal limits    EKG None  Radiology No results found.  Procedures Procedures    Medications Ordered in ED Medications - No data  to display  ED Course/ Medical Decision Making/ A&P                             Medical Decision Making Amount and/or Complexity of Data Reviewed Labs: ordered.   This patient presents to the ED with chief complaint(s) of elevated blood pressure with pertinent past medical history of hypertension, noncompliance with antihypertensives recently.The complaint involves an extensive differential diagnosis and also carries with it a high risk of complications and morbidity.    Patient states that she took BP medicines before coming here.  She stopped by pharmacy and pick them up.  Blood pressure now in the 130s.  She also complains of swelling to her extremity and discoloration of the skin.  Differential diagnosis for her includes new onset CHF secondary to uncontrolled blood pressure, hepatitis, liver cirrhosis, renal failure, severe electrolyte abnormality.  The initial plan is to get CBC, CMP.  No need for EKG at this time.  Patient has complained of lightheadedness, but she has no neuro deficits right now with no headaches.  No indication for CT scan of the brain.   Additional history obtained: Records reviewed Primary Care Documents  Labs independently reviewed, CBC CMP is normal.   Treatment and Reassessment: The patient appears reasonably screened and/or stabilized for discharge and I doubt any other medical condition or other [facility] requiring further screening, evaluation, or treatment in the ED at this time prior to discharge.   Results from the ER workup discussed with the patient face to face and all questions answered to the best of my ability. The patient is safe for discharge with strict return precautions.   Final Clinical Impression(s) / ED Diagnoses Final diagnoses:  Uncontrolled hypertension  Peripheral edema    Rx / DC Orders ED Discharge Orders     None         Derwood Kaplan, MD 07/08/22 1538

## 2022-07-08 NOTE — ED Triage Notes (Signed)
Pt arrives to ED with c/o HTN. She notes lightheadedness for weeks. BP at work today over 200 systolic. Has not taking antihypertensives in a handful of weeks.

## 2022-07-08 NOTE — Discharge Instructions (Addendum)
The workup in the ER including your kidney function, liver function is normal.  No evidence of jaundice.  For the elevated blood pressure, please start taking her medication as prescribed.  Take Lasix as recommended by your PCP.  Please keep track of your blood pressure over the next week.  Return to the PCP in 2 to 3 weeks with a blood pressure log so that they can adjust the medications if needed.

## 2022-09-19 ENCOUNTER — Encounter: Payer: Self-pay | Admitting: Family

## 2022-09-19 ENCOUNTER — Ambulatory Visit: Payer: BC Managed Care – PPO | Admitting: Family

## 2022-09-19 VITALS — BP 134/88 | HR 93 | Temp 95.3°F | Wt 335.0 lb

## 2022-09-19 DIAGNOSIS — F419 Anxiety disorder, unspecified: Secondary | ICD-10-CM

## 2022-09-19 DIAGNOSIS — I10 Essential (primary) hypertension: Secondary | ICD-10-CM

## 2022-09-19 DIAGNOSIS — I83893 Varicose veins of bilateral lower extremities with other complications: Secondary | ICD-10-CM

## 2022-09-19 DIAGNOSIS — M545 Low back pain, unspecified: Secondary | ICD-10-CM | POA: Insufficient documentation

## 2022-09-19 DIAGNOSIS — Z6841 Body Mass Index (BMI) 40.0 and over, adult: Secondary | ICD-10-CM

## 2022-09-19 DIAGNOSIS — K8020 Calculus of gallbladder without cholecystitis without obstruction: Secondary | ICD-10-CM | POA: Insufficient documentation

## 2022-09-19 DIAGNOSIS — D229 Melanocytic nevi, unspecified: Secondary | ICD-10-CM

## 2022-09-19 DIAGNOSIS — M5441 Lumbago with sciatica, right side: Secondary | ICD-10-CM

## 2022-09-19 DIAGNOSIS — Z1211 Encounter for screening for malignant neoplasm of colon: Secondary | ICD-10-CM

## 2022-09-19 DIAGNOSIS — F32A Depression, unspecified: Secondary | ICD-10-CM

## 2022-09-19 DIAGNOSIS — Z800 Family history of malignant neoplasm of digestive organs: Secondary | ICD-10-CM

## 2022-09-19 DIAGNOSIS — G8929 Other chronic pain: Secondary | ICD-10-CM | POA: Insufficient documentation

## 2022-09-19 MED ORDER — METOPROLOL SUCCINATE ER 25 MG PO TB24: 25.0000 mg | ORAL_TABLET | Freq: Every day | ORAL | 1 refills | Status: DC

## 2022-09-19 MED ORDER — NAPROXEN 500 MG PO TABS: 500.0000 mg | ORAL_TABLET | Freq: Two times a day (BID) | ORAL | 0 refills | Status: DC

## 2022-09-19 MED ORDER — AMLODIPINE BESYLATE 10 MG PO TABS: 10.0000 mg | ORAL_TABLET | Freq: Every day | ORAL | 1 refills | Status: DC

## 2022-09-19 MED ORDER — ESCITALOPRAM OXALATE 10 MG PO TABS: 10.0000 mg | ORAL_TABLET | Freq: Every day | ORAL | 1 refills | Status: DC

## 2022-09-19 MED ORDER — FUROSEMIDE 20 MG PO TABS: 20.0000 mg | ORAL_TABLET | Freq: Every day | ORAL | 1 refills | Status: DC

## 2022-09-19 NOTE — Progress Notes (Signed)
Patient ID: Emily Roy, female    DOB: October 29, 1975, 47 y.o.   MRN: [id number]  Chief Complaint  Patient presents with   Hypertension   Back Pain    sx for about 5mos   Skin Tag    Pt c/o moles on back and side of neck that are painful. Moles on neck are recent and growing in size.    Depression    HPI: Hypertension: Patient is currently maintained on the following medications for blood pressure: Amlodipine, Lasix, Toprol Failed meds include: Olmesartan, turned skin orange- brown on bilateral arms, then down legs, scalp tingling, stopped med & sx resolved. Possibly jaundice? pt did not let me know or go to UC.  Patient reports fair compliance with blood pressure medications. Patient denies chest pain,  shortness of breath. Pt has  moderate swelling in both legs with varicose veins & spider veins in the past, but states overall doing better. Pt states she never tried compression socks.  Last 3 blood pressure readings in our office are as follows: BP Readings from Last 3 Encounters:  09/19/22 134/88  07/08/22 (!) 177/115  01/10/22 (!) 146/100  Anxiety/Depression: Patient complains of anxiety disorder and depression. She has the following symptoms: chest pain, difficulty concentrating, fatigue, feelings of losing control, irritable, palpitations, racing thoughts, shortness of breath, sweating.  Onset of symptoms was approximately  years ago, She denies current suicidal and homicidal ideation.  Risk factors: negative life event recent medical problems and previous episode of depression. Pt restarted on Lexapro last visit and doing better.  Abnormal moles:  on left shoulder close to neck, crusty lesion, approx 0.4cm in diameter, tender & has grown in size, several skin tags in same area, also c/o lower back/waist mole that is also tender, but unsure if has grown in size.  Back pain & Abd pain:  Pt c/o Dull/achy pain in the center and lower right side of her back  for 5  months,describes as stabbing, burning, tingling pain in her back. Pt sometimes feels around her side to RUQ, hx of gallstones, and had N,V with that 64yrs ago, but states she hasn't had any episodes since then, same time she moved here, but also states she hasn't really changed her diet either, still eats fried foods often. This pain is presenting a little differently than before, no N,V and starts in her back, but it is worse after meals. Has tried advil which helps just a little, Constant and getting worse.  Assessment & Plan:  Essential hypertension Assessment & Plan: chronic, stable, but seen 2 mos ago in ER d/t running out of meds & BP malignant at her work CMP normal in ER, CBC w/mild anemia taking Amlodipine 10mg  qhs, Metoprolol 25mg  ER, taking Lasix 20mg  qd and 40mg  on days of increased swelling prn, but states since her BP under better control, she has not had swelling often at all Increased Olmesartan to 40mg  every day last visit but pt reports it caused her arms to turn orange-brown color, a tingling feeling on her scalp and then went down to her legs so she stopped this & did not make her BP worse and the discoloration went away within a few weeks refilling amlodipine & Metoprolol, Lasix today F/U 6 mos   Orders: -     Metoprolol Succinate ER; Take 1 tablet (25 mg total) by mouth at bedtime.  Dispense: 90 tablet; Refill: 1 -     amLODIPine Besylate; Take 1 tablet (10 mg total) by  mouth daily.  Dispense: 90 tablet; Refill: 1 -     Furosemide; Take 1 tablet (20 mg total) by mouth daily.  Dispense: 90 tablet; Refill: 1  Anxiety and depression Assessment & Plan: Chronic, stable both parents died in 24-Oct-2018 - has a brother on disability who was living with her, but now back in [place], but she worries as he has colon cancer but not getting treated & she is very worried taking Lexapro 10mg  qd, but ran out about 2 mos ago refilling Lexapro 10mg  f/u 6 mo  Orders: -     Escitalopram Oxalate;  Take 1 tablet (10 mg total) by mouth daily.  Dispense: 90 tablet; Refill: 1  Multiple gallstones Assessment & Plan: reports first dx 7 yrs ago had severe RUQ pain that required Hydrocodone, w/N,V but no GB inflammation pain resolved she states w/o changing her diet, moved here, eats the same but has had no symptoms today reports low back pain that radiates to her side & RUQ, worse after meals believe she could have 2 separate problems overlapping, also working up for back pain advised to avoid greasy, fried foods ordering Abd U/S  Orders: -     US Abdomen Limited; Future  Chronic right-sided low back pain with right-sided sciatica Assessment & Plan: has had more wt gain & understands this may be contributing to her pain states pain radiates around her side and RUQ at times, but believe this is separate problem, possibly overlapping, working up for gallstones also denies radiating pain down leg, reports tingling in low back pt does not exercise, works long hours, 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 for pain.  Dispense: 60 tablet; Refill: 0 -     Ambulatory referral to Orthopedic Surgery  Varicose veins of both legs with edema Assessment & Plan: chronic, intermittent pt is morbidly obese works security full time - alternates b/w walking and sitting taking Lasix 20mg  prn continue to advise on wearing mild compression socks up to knees pt reports overall lately the swelling has not been as bad, she thinks related to her BP doing better sending Lasix refill today F/U in 6 mos or prn   Multiple atypical skin moles -     Ambulatory referral to Dermatology  Morbidly obese [facility]) Assessment & Plan: Chronic, Unstable, wt has increased again pt was  considering [facility] continue to advise pt on wt loss strategies and reinforce education on harm to all organs due to obesity   Screening for colon cancer -     Ambulatory referral to Gastroenterology  Family history of colon cancer -     Ambulatory referral to Gastroenterology    Subjective:    Outpatient Medications Prior to Visit  Medication Sig Dispense Refill   amLODipine (NORVASC) 10 MG tablet TAKE 1 TABLET BY MOUTH EVERY DAY 90 tablet 1   escitalopram (LEXAPRO) 10 MG tablet Take 1 tablet (10 mg total) by mouth daily. 30 tablet 1   furosemide (LASIX) 20 MG tablet TAKE 1 TABLET BY MOUTH EVERY DAY 90 tablet 1   metoprolol succinate (TOPROL-XL) 25 MG 24 hr tablet Take 1 tablet (25 mg total) by mouth at bedtime. OK to start with 1/2 pill for  a few nights, then go to a full pill if tolerated. 30 tablet 5   olmesartan (BENICAR) 20 MG tablet TAKE 1 TABLET BY MOUTH EVERY DAY (Patient not taking: Reported on 09/19/2022) 90 tablet 0   No facility-administered medications prior to visit.   Past Medical History:  Diagnosis Date   Acute DVT (deep venous thrombosis) (HCC) 07/01/2013   Dysmenorrhea 09/29/2005   Formatting of this note might be different from the original. spotting   Fatigue 09/03/2007   Hypertension    Long term (current) use of anticoagulants 07/05/2013   Persistent headaches 01/10/2022   No past surgical history on file. Allergies  Allergen Reactions   Olmesartan Other (See Comments)    skin turn orange, scalp tingling, worsened BP      Objective:    Physical Exam Vitals and nursing note reviewed.  Constitutional:      Appearance: Normal appearance. She is morbidly obese.  Cardiovascular:     Rate and Rhythm: Normal rate and regular rhythm.  Pulmonary:     Effort: Pulmonary effort is normal.     Breath sounds: Normal breath sounds.  Musculoskeletal:        General: Normal range of motion.  Skin:    General: Skin is warm and dry.   Neurological:     Mental Status: She is alert.  Psychiatric:        Mood and Affect: Mood normal.        Behavior: Behavior normal.    BP 134/88 (BP Location: Left Arm, Patient Position: Sitting, Cuff Size: Large)   Pulse 93   Temp (!) 95.3 F (35.2 C) (Temporal)   Wt (!) 335 lb (152 kg)   SpO2 96%   BMI 50.94 kg/m  Wt Readings from Last 3 Encounters:  09/19/22 (!) 335 lb (152 kg)  07/08/22 (!) 325 lb (147.4 kg)  01/10/22 (!) 328 lb (148.8 kg)       Dulce Sellar, NP

## 2022-09-19 NOTE — Assessment & Plan Note (Addendum)
reports first dx 7 yrs ago had severe RUQ pain that required Hydrocodone, w/N,V but no GB inflammation pain resolved she states w/o changing her diet, moved here, eats the same but has had no symptoms today reports low back pain that radiates to her side & RUQ, worse after meals believe she could have 2 separate problems overlapping, also working up for back pain advised to avoid greasy, fried foods ordering Abd U/S

## 2022-09-19 NOTE — Assessment & Plan Note (Signed)
Chronic, Unstable, wt has increased again pt was considering [facility] weight loss clinic continue to advise pt on wt loss strategies and reinforce education on harm to all organs due to obesity

## 2022-09-19 NOTE — Assessment & Plan Note (Addendum)
chronic, stable, but seen 2 mos ago in ER d/t running out of meds & BP malignant at her work CMP normal in ER, CBC w/mild anemia taking Amlodipine 10mg  qhs, Metoprolol 25mg  ER, taking Lasix 20mg  qd and 40mg  on days of increased swelling prn, but states since her BP under better control, she has not had swelling often at all Increased Olmesartan to 40mg  every day last visit but pt reports it caused her arms to turn orange-brown color, a tingling feeling on her scalp and then went down to her legs so she stopped this & did not make her BP worse and the discoloration went away within a few weeks refilling amlodipine & Metoprolol, Lasix today F/U 6 mos

## 2022-09-19 NOTE — Assessment & Plan Note (Signed)
Chronic, stable both parents died in 16-Oct-2018 - has a brother on disability who was living with her, but now back in [place], but she worries as he has colon cancer but not getting treated & she is very worried taking Lexapro 10mg  qd, but ran out about 2 mos ago refilling Lexapro 10mg  f/u 6 mo

## 2022-09-19 NOTE — Patient Instructions (Addendum)
It was very nice to see you today!     I have sent your refills.  I sent the order for your gallbladder ultrasound, Dermatology and colonoscopy referral. They should all you directly to schedule. If your Ultrasound is negative, I will send you to Ortho regarding your back pain.   Schedule a 4 month follow up visit for refills & recheck.    PLEASE NOTE:  If you had any lab tests please let us know if you have not heard back within a few days. You may see your results on MyChart before we have a chance to review them but we will give you a call once they are reviewed by [place]. If we ordered any referrals today, please let us know if you have not heard from their office within the next week.

## 2022-09-19 NOTE — Assessment & Plan Note (Addendum)
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

## 2022-09-19 NOTE — Assessment & Plan Note (Addendum)
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

## 2022-10-04 ENCOUNTER — Ambulatory Visit (INDEPENDENT_AMBULATORY_CARE_PROVIDER_SITE_OTHER): Payer: BC Managed Care – PPO | Admitting: Physical Medicine and Rehabilitation

## 2022-10-04 ENCOUNTER — Encounter: Payer: Self-pay | Admitting: Physical Medicine and Rehabilitation

## 2022-10-04 ENCOUNTER — Other Ambulatory Visit (INDEPENDENT_AMBULATORY_CARE_PROVIDER_SITE_OTHER): Payer: BC Managed Care – PPO

## 2022-10-04 DIAGNOSIS — M5441 Lumbago with sciatica, right side: Secondary | ICD-10-CM

## 2022-10-04 DIAGNOSIS — M7918 Myalgia, other site: Secondary | ICD-10-CM

## 2022-10-04 DIAGNOSIS — M5416 Radiculopathy, lumbar region: Secondary | ICD-10-CM | POA: Diagnosis not present

## 2022-10-04 DIAGNOSIS — G8929 Other chronic pain: Secondary | ICD-10-CM

## 2022-10-04 DIAGNOSIS — M546 Pain in thoracic spine: Secondary | ICD-10-CM

## 2022-10-04 NOTE — Progress Notes (Signed)
Functional Pain Scale - descriptive words and definitions  Distracting (5)    Aware of pain/able to complete some ADL's but limited by pain/sleep is affected and active distractions are only slightly useful. Moderate range order  Average Pain 6, but can get worse  Lower back pain in the middle that radiates to the right side and into right leg

## 2022-10-04 NOTE — Progress Notes (Signed)
Emily Roy - 47 y.o. female MRN [id number]  Date of birth: 1976-02-27  Office Visit Note: Visit Date: 10/04/2022 PCP: Dulce Sellar, NP Referred by: Dulce Sellar, NP  Subjective: Chief Complaint  Patient presents with   Lower Back - Pain   HPI: Emily Roy is a 47 y.o. female who comes in today per the request of Landry Mellow, NP for evaluation of chronic, worsening and severe right sided lower back pain radiating to buttock and up to mid thoracic back. Pain ongoing since 2012 after mechanical fall (work related injury where she fell down stairs). States pain has become worse over the last year. Pain worsens with movement and activity, reports severe pain with prolonged sitting and standing. She describes pain as sore and aching, currently rates as 6 out of 10. Some relief of pain with home exercise regimen, rest and use of medications. States she does take Tylenol #3 that was prescribed many years ago. Patient works full time as Glass blower/designer for [employer]   No prior lumbar imaging. No history of lumbar surgery/injections.  Patient denies focal weakness, numbness and tingling.  No recent trauma or falls.  Of note, patient does carry diagnosis of morbid obesity and history of DVT.  She is not currently taking anticoagulant medication.   Oswestry Disability Index Score 22% 10 to 20 (40%) moderate disability: The patient experiences more pain and difficulty with sitting, lifting and standing. Travel and social life are more difficult, and they may be disabled from work. Personal care, sexual activity and sleeping are not grossly affected, and the patient can usually be managed by conservative means.  Review of Systems  Musculoskeletal:  Positive for back pain and myalgias.  Neurological:  Negative for tingling, sensory change, focal weakness and weakness.  All other systems reviewed and are negative.  Otherwise per HPI.  Assessment &  Plan: Visit Diagnoses:    ICD-10-CM   1. Chronic right-sided low back pain with right-sided sciatica  M54.41    G89.29     2. Lumbar radiculopathy  M54.16 XR Lumbar Spine 2-3 Views    3. Chronic bilateral thoracic back pain  M54.6    G89.29     4. Myofascial pain syndrome  M79.18     5. Morbidly obese (HCC)  E66.01        Plan: Findings:  Chronic, worsening and severe right sided lower back pain radiating to buttock, mid posterior thigh and up to mid thoracic region. Patient continues to have severe pain despite good conservative therapy such as home exercise regimen, rest and use of medications.  Patient's clinical presentation and exam are complex, differentials include lumbar radiculopathy, facet mediated pain and myofascial pain syndrome.  Tenderness noted to bilateral thoracic and lumbar paraspinal region upon exam today.  I obtained 2 view lumbar x-ray in the office that look relatively normal, degenerative changes noted to lower lumbar spine. Next step is to obtain lumbar MRI imaging. Depending on results of lumbar MRI imaging I discussed possibility of lumbar injection. I will have her follow up for lumbar MRI review and to discuss treatment options. No red flag symptoms noted upon exam today.     Meds & Orders: No orders of the defined types were placed in this encounter.   Orders Placed This Encounter  Procedures   XR Lumbar Spine 2-3 Views    Follow-up: Return for follow up for lumbar MRI review.   Procedures: No procedures performed      Clinical History: No  specialty comments available.   She reports that she has never smoked. She has never used smokeless tobacco. No results for input(s): "HGBA1C", "LABURIC" in the last 8760 hours.  Objective:  VS:  HT:    WT:   BMI:     BP:   HR: bpm  TEMP: ( )  RESP:  Physical Exam Vitals and nursing note reviewed.  HENT:     Head: Normocephalic and atraumatic.     Right Ear: External ear normal.     Left Ear: External  ear normal.     Nose: Nose normal.     Mouth/Throat:     Mouth: Mucous membranes are moist.  Eyes:     Extraocular Movements: Extraocular movements intact.  Cardiovascular:     Rate and Rhythm: Normal rate.     Pulses: Normal pulses.  Pulmonary:     Effort: Pulmonary effort is normal.  Abdominal:     General: Abdomen is flat. There is no distension.  Musculoskeletal:        General: Tenderness present.     Cervical back: Normal range of motion.     Comments: Patient rises from seated position to standing without difficulty. Good lumbar range of motion. No pain noted with facet loading. 5/5 strength noted with bilateral hip flexion, knee flexion/extension, ankle dorsiflexion/plantarflexion and EHL. No clonus noted bilaterally. No pain upon palpation of greater trochanters. No pain with internal/external rotation of bilateral hips. Sensation intact bilaterally. Tenderness noted to bilateral thoracic and lumbar paraspinal regions. Negative slump test bilaterally. Ambulates without aid, gait steady.     Skin:    General: Skin is warm and dry.     Capillary Refill: Capillary refill takes less than 2 seconds.  Neurological:     General: No focal deficit present.     Mental Status: She is alert and oriented to person, place, and time.  Psychiatric:        Mood and Affect: Mood normal.        Behavior: Behavior normal.     Ortho Exam  Imaging: XR Lumbar Spine 2-3 Views  Result Date: 10/04/2022 AP and lateral radiographs of lumbar spine exhibit normal anatomical alignment, no spondylolisthesis. Degenerative changes noted to lower lumbar spine. No fractures or dislocations.    Past Medical/Family/Surgical/Social History: Medications & Allergies reviewed per EMR, new medications updated. Patient Active Problem List   Diagnosis Date Noted   Multiple gallstones 09/19/2022   Chronic right-sided low back pain without sciatica 09/19/2022   Varicose veins of both legs with edema 12/10/2021    Essential hypertension 10/14/2021   Perimenopausal menorrhagia 10/14/2021   History of DVT (deep vein thrombosis) 10/14/2021   Anxiety and depression 09/12/2011   Left knee pain 09/12/2011   Morbidly obese (HCC) 09/12/2011   Past Medical History:  Diagnosis Date   Acute DVT (deep venous thrombosis) (HCC) 07/01/2013   Dysmenorrhea 09/29/2005   Formatting of this note might be different from the original. spotting   Fatigue 09/03/2007   Hypertension    Long term (current) use of anticoagulants 07/05/2013   Persistent headaches 01/10/2022   No family history on file. No past surgical history on file. Social History   Occupational History   Not on file  Tobacco Use   Smoking status: Never   Smokeless tobacco: Never  Substance and Sexual Activity   Alcohol use: No   Drug use: Not Currently   Sexual activity: Not Currently    Birth control/protection: Surgical

## 2022-12-12 ENCOUNTER — Ambulatory Visit: Payer: BC Managed Care – PPO | Admitting: Family

## 2022-12-12 ENCOUNTER — Encounter: Payer: Self-pay | Admitting: Family

## 2022-12-12 VITALS — BP 121/79 | HR 95 | Temp 97.8°F | Ht 68.0 in | Wt 331.0 lb

## 2022-12-12 DIAGNOSIS — I10 Essential (primary) hypertension: Secondary | ICD-10-CM | POA: Diagnosis not present

## 2022-12-12 DIAGNOSIS — Z23 Encounter for immunization: Secondary | ICD-10-CM | POA: Diagnosis not present

## 2022-12-12 DIAGNOSIS — E876 Hypokalemia: Secondary | ICD-10-CM | POA: Diagnosis not present

## 2022-12-12 DIAGNOSIS — I83893 Varicose veins of bilateral lower extremities with other complications: Secondary | ICD-10-CM | POA: Diagnosis not present

## 2022-12-12 DIAGNOSIS — T502X5A Adverse effect of carbonic-anhydrase inhibitors, benzothiadiazides and other diuretics, initial encounter: Secondary | ICD-10-CM

## 2022-12-12 MED ORDER — FUROSEMIDE 20 MG PO TABS
20.0000 mg | ORAL_TABLET | Freq: Every day | ORAL | 1 refills | Status: DC
Start: 2022-12-12 — End: 2023-06-08

## 2022-12-12 MED ORDER — POTASSIUM CHLORIDE CRYS ER 10 MEQ PO TBCR
10.0000 meq | EXTENDED_RELEASE_TABLET | Freq: Every day | ORAL | 1 refills | Status: DC
Start: 2022-12-12 — End: 2023-06-08

## 2022-12-12 NOTE — Progress Notes (Unsigned)
Patient ID: Neville Aguero, female    DOB: 1975-04-16, 48 y.o.   MRN: [id number]  Chief Complaint  Patient presents with  . Leg Pain    LT/Rt pain in legs and knees, cramping, pt had fall 1 yr ago and not sure if it is connected. Pain has been on and off but x2 weeks has been consistent     HPI: Hypertension/Leg swelling: Patient is currently maintained on the following medications for blood pressure: Amlodipine, Lasix, Toprol Failed meds include: Olmesartan, turned skin orange- brown on bilateral arms, then down legs, scalp tingling, stopped med & sx resolved. Possibly jaundice? pt did not let me know or go to UC.  Patient reports fair compliance with blood pressure medications. Patient denies chest pain,  shortness of breath. Pt has  moderate swelling in both legs with varicose veins & spider veins in the past, but states overall doing better. Pt states she never tried compression socks. She reports having large amount of swelling on Thursday and took 2 Lasix pills, but then had severe cramping during the night and missed work on Friday. She then was afraid to take anymore Lasix Friday-today and swelling is present, but not as bad as it had been.  Last 3 blood pressure readings in our office are as follows: BP Readings from Last 3 Encounters:  12/12/22 121/79  09/19/22 134/88  07/08/22 (!) 177/115     Assessment & Plan:  Essential hypertension -     Furosemide; Take 1-2 tablets (20-40 mg total) by mouth daily.  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 Take daily with Lasix, if you take 2 pills of Lasix, take 2 of these.  Dispense: 90 tablet; Refill: 1  Immunization due -     Tdap vaccine greater than or equal to 7yo IM    Subjective:    Outpatient Medications Prior to Visit  Medication Sig Dispense Refill  . amLODipine (NORVASC) 10 MG tablet Take 1 tablet (10 mg total) by mouth daily. 90 tablet 1  . escitalopram (LEXAPRO) 10 MG tablet Take 1 tablet (10 mg total) by mouth daily. 90 tablet 1  . metoprolol succinate (TOPROL-XL) 25 MG 24 hr tablet Take 1 tablet (25 mg total) by mouth at bedtime. 90 tablet 1  . naproxen (NAPROSYN) 500 MG tablet Take 1 tablet (500 mg total) by mouth 2 (two) times daily with a meal. for pain. 60 tablet 0  . furosemide (LASIX) 20 MG tablet Take 1 tablet (20 mg total) by mouth daily. 90 tablet 1  . olmesartan (BENICAR) 20 MG tablet TAKE 1 TABLET BY MOUTH EVERY DAY (Patient not taking: Reported on 09/19/2022) 90 tablet 0   No facility-administered medications prior to visit.   Past Medical History:  Diagnosis Date  . Acute DVT (deep venous thrombosis) (HCC) 07/01/2013  . Dysmenorrhea 09/29/2005   Formatting of this note might be different from the original. spotting  . Fatigue 09/03/2007  . Hypertension   .  Long term (current) use of anticoagulants 07/05/2013  . Persistent headaches 01/10/2022   History reviewed. No pertinent surgical history. Allergies  Allergen Reactions  . Olmesartan Other (See Comments)    skin turn orange, scalp tingling, worsened BP      Objective:    Physical Exam Vitals and nursing note reviewed.  Constitutional:      Appearance: Normal appearance. She is obese.  Cardiovascular:     Rate and Rhythm: Normal rate and regular rhythm.  Pulmonary:     Effort: Pulmonary effort is normal.     Breath sounds: Normal breath sounds.  Musculoskeletal:        General: Normal range of motion.  Skin:     General: Skin is warm and dry.  Neurological:     Mental Status: She is alert.  Psychiatric:        Mood and Affect: Mood normal.        Behavior: Behavior normal.   BP 121/79   Pulse 95   Temp 97.8 F (36.6 C) (Temporal)   Ht 5\' 8"  (1.727 m)   Wt (!) 331 lb (150.1 kg)   SpO2 98%   BMI 50.33 kg/m  Wt Readings from Last 3 Encounters:  12/12/22 (!) 331 lb (150.1 kg)  09/19/22 (!) 335 lb (152 kg)  07/08/22 (!) 325 lb (147.4 kg)      Dulce Sellar, NP

## 2022-12-12 NOTE — Assessment & Plan Note (Signed)
chronic, stable, but seen 2 mos ago in ER d/t running out of meds & BP malignant at her work CMP normal in ER, CBC w/mild anemia taking Amlodipine 10mg  qhs, Metoprolol 25mg  ER, taking Lasix 20mg  qd and 40mg  on days of increased swelling prn, but states since her BP under better control, she has not had swelling often at all Increased Olmesartan to 40mg  every day last visit but pt reports it caused her arms to turn orange-brown color, a tingling feeling on her scalp and then went down to her legs so she stopped this & did not make her BP worse and the discoloration went away within a few weeks refilling amlodipine & Metoprolol, Lasix today F/U 6 mos

## 2022-12-12 NOTE — Assessment & Plan Note (Signed)
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

## 2022-12-13 DIAGNOSIS — E876 Hypokalemia: Secondary | ICD-10-CM | POA: Insufficient documentation

## 2022-12-13 NOTE — Assessment & Plan Note (Signed)
New sending Potassium supplement every day, if taking 40mg  Lasix pt advised to come back in one week for lab only visit, check BMP f/u 18m

## 2022-12-19 ENCOUNTER — Ambulatory Visit: Payer: BC Managed Care – PPO | Admitting: Family Medicine

## 2022-12-19 ENCOUNTER — Encounter: Payer: Self-pay | Admitting: Family Medicine

## 2022-12-19 ENCOUNTER — Other Ambulatory Visit: Payer: BC Managed Care – PPO

## 2022-12-19 VITALS — BP 134/78 | HR 94 | Temp 98.1°F | Ht 68.0 in | Wt 334.5 lb

## 2022-12-19 DIAGNOSIS — J029 Acute pharyngitis, unspecified: Secondary | ICD-10-CM

## 2022-12-19 DIAGNOSIS — R051 Acute cough: Secondary | ICD-10-CM

## 2022-12-19 DIAGNOSIS — J069 Acute upper respiratory infection, unspecified: Secondary | ICD-10-CM

## 2022-12-19 LAB — POCT RESPIRATORY SYNCYTIAL VIRUS: RSV Rapid Ag: NEGATIVE

## 2022-12-19 LAB — POCT RAPID STREP A (OFFICE): Rapid Strep A Screen: NEGATIVE

## 2022-12-19 LAB — POC INFLUENZA A&B (BINAX/QUICKVUE)
Influenza A, POC: NEGATIVE
Influenza B, POC: NEGATIVE

## 2022-12-19 LAB — POC COVID19 BINAXNOW: SARS Coronavirus 2 Ag: NEGATIVE

## 2022-12-19 MED ORDER — AMOXICILLIN-POT CLAVULANATE 875-125 MG PO TABS: 1.0000 | ORAL_TABLET | Freq: Two times a day (BID) | ORAL | 0 refills | Status: AC

## 2022-12-19 MED ORDER — PROMETHAZINE-DM 6.25-15 MG/5ML PO SYRP: 5.0000 mL | ORAL_SOLUTION | Freq: Four times a day (QID) | ORAL | 0 refills | Status: AC | PRN

## 2022-12-19 NOTE — Progress Notes (Signed)
Established Patient Office Visit  Subjective   Patient ID: Emily Roy, female    DOB: Apr 25, 1975  Age: 47 y.o. MRN: [id number]  Chief Complaint  Patient presents with   Shortness of Breath   Cough    Pt is here today with C/O cough, sob,headache. Pt has not tested for Covid. Sx started Friday 12/16/2022   HPI Patient presents to the office today for complaint of cough, shortness of breath, and headache. Patient reports symptoms began on Friday 12/16/2022. She reports that she has a productive cough with thick green sputum. She reports that her sputum has become thinner over past couple days but more in amount.  Patient denies nasal drainage.  Patient denies sinus pressure and body aches. Patient denies any difficulty swallowing.  Does complain of thoracic pain, patient reports from coughing, does deny cardiac chest pain. Patient  reports shortness of breath with exertion and while lying flat in bed, having to use more pillows while sleeping.  Patient denies any fever, chills, nausea or vomiting.  Patient reports she is taking Mucinex.  Patient does report that she is more fatigued.   Review of Systems  Constitutional:  Positive for malaise/fatigue.  HENT:  Positive for sore throat. Negative for congestion, ear discharge, ear pain and sinus pain.   Respiratory:  Positive for cough, sputum production and shortness of breath.   Cardiovascular:  Negative for chest pain.      Objective:   BP 134/78   Pulse 94   Temp 98.1 F (36.7 C)   Ht 5\' 8"  (1.727 m)   Wt (!) 334 lb 8 oz (151.7 kg)   SpO2 97%   BMI 50.86 kg/m    Physical Exam Constitutional:      General: She is not in acute distress.    Appearance: She is well-developed. She is obese. She is ill-appearing. She is not toxic-appearing or diaphoretic.  HENT:     Head: Normocephalic and atraumatic.     Right Ear: Tympanic membrane, ear canal and external ear normal.     Left Ear: Tympanic membrane, ear canal and  external ear normal.     Nose: No congestion.     Mouth/Throat:     Mouth: Mucous membranes are moist.     Pharynx: Posterior oropharyngeal erythema present.     Tonsils: 1+ on the right. 1+ on the left.  Cardiovascular:     Rate and Rhythm: Normal rate and regular rhythm.     Heart sounds: Normal heart sounds.  Pulmonary:     Effort: Pulmonary effort is normal.     Breath sounds: Decreased air movement present. Examination of the right-upper field reveals decreased breath sounds. Examination of the left-upper field reveals decreased breath sounds. Examination of the right-middle field reveals decreased breath sounds. Examination of the right-lower field reveals decreased breath sounds. Decreased breath sounds present.  Lymphadenopathy:     Head:     Right side of head: Submental and submandibular adenopathy present.     Left side of head: Submental and submandibular adenopathy present.  Skin:    General: Skin is warm and dry.  Neurological:     Mental Status: She is alert and oriented to person, place, and time.  Psychiatric:        Mood and Affect: Mood normal.        Behavior: Behavior normal.        Thought Content: Thought content normal.        Judgment: Judgment normal.  Results for orders placed or performed in visit on 12/19/22  POC COVID-19  Result Value Ref Range   SARS Coronavirus 2 Ag Negative Negative  POCT rapid strep A  Result Value Ref Range   Rapid Strep A Screen Negative Negative  POCT respiratory syncytial virus  Result Value Ref Range   RSV Rapid Ag NEG   POC Influenza A&B (Binax test)  Result Value Ref Range   Influenza A, POC Negative Negative   Influenza B, POC Negative Negative     Assessment & Plan:   Problem List Items Addressed This Visit   None Visit Diagnoses     Acute upper respiratory infection    -  Primary   Sore throat       Relevant Orders   POCT rapid strep A (Completed)   Acute cough       Relevant Orders   POC COVID-19  (Completed)   POCT respiratory syncytial virus (Completed)   POC Influenza A&B (Binax test) (Completed)      1.Rapid Covid, Influenza, Strep, and RSV were all negative 2.Ordered Augmentin 875/125mg  tablet twice a day for 7 days for upper respiratory infection. Concerned with the amount of mucous with productive cough and decrease breath sounds. Also, concerned about lymph nodes being tender and swollen.  3.Ordered Promethazine DM 5ml every 6 hours as needed for cough 4. Advised to continue Mucinex 5. Advised her to increase rest and fluids 6. Advised her if her symptoms are not better in 7 days follow up with Primary Care or this office. 7. Advised if shortness of breath worsens, please go to the nearest Emergency Department  I personally was present during the history, physical exam, and medical decision-making activities of this service and have verified that the service and findings are accurately documented in the nurse practitioner student's note.  Zandra Abts, NP  Zandra Abts, NP

## 2022-12-19 NOTE — Patient Instructions (Signed)
-  Rapid Covid, Influenza, Strep, and RSV were all negative -START Augmentin 875/125mg  tablet twice a day for 7 days -START Promethazine DM 5ml every 6 hours as needed for cough -Continue Mucinex -Recommend to increase rest and fluids -If symptoms are not better in 7 days follow up with Primary Care or this office. -If shortness of breath worsens, please go to the nearest Emergency Department

## 2022-12-25 NOTE — Progress Notes (Deleted)
[facility] [place]  ASSESSMENT / PLAN: Emily Roy is a 47 y.o. female with chronic venous insufficiency of *** lower extremity causing *** (C*** disease).  Venous duplex is significant for *** Recommend compression and elevation for symptomatic relief. ***Follow up with me in three months to discuss saphenous vein ablation. ***Follow up with me as needed.   CHIEF COMPLAINT: ***  HISTORY OF PRESENT ILLNESS: Emily Roy is a 47 y.o. female ***  VASCULAR SURGICAL HISTORY: ***  VASCULAR RISK FACTORS: {FINDINGS; POSITIVE NEGATIVE:[phone]} history of stroke / transient ischemic attack. {FINDINGS; POSITIVE NEGATIVE:[phone]} history of coronary artery disease. *** history of PCI. *** history of CABG.  {FINDINGS; POSITIVE NEGATIVE:[phone]} history of diabetes mellitus. Last A1c ***. {FINDINGS; POSITIVE NEGATIVE:[phone]} history of smoking. *** actively smoking. {FINDINGS; POSITIVE NEGATIVE:[phone]} history of hypertension. *** drug regimen with *** control. {FINDINGS; POSITIVE NEGATIVE:[phone]} history of chronic kidney disease.  Last GFR ***. CKD {stage:30421363}. {FINDINGS; POSITIVE NEGATIVE:[phone]} history of chronic obstructive pulmonary disease, treated with ***.  FUNCTIONAL STATUS: ECOG performance status: {findings; ecog performance status:31780} Ambulatory status: {TNHAmbulation:25868}  CAREY 1 AND 3 YEAR INDEX Female (2pts) 75-79 or 80-84 (2pts) >84 (3pts) Dependence in toileting (1pt) Partial or full dependence in dressing (1pt) History of malignant neoplasm (2pts) CHF (3pts) COPD (1pts) CKD (3pts)  0-3 pts 6% 1 year mortality ; 21% 3 year mortality 4-5 pts 12% 1 year mortality ; 36% 3 year mortality >5 pts 21% 1 year mortality; 54% 3 year mortality   Past Medical History:  Diagnosis Date   Acute DVT (deep venous thrombosis) (HCC) 07/01/2013   Dysmenorrhea 09/29/2005   Formatting of this note  might be different from the original. spotting   Fatigue 09/03/2007   Hypertension    Long term (current) use of anticoagulants 07/05/2013   Persistent headaches 01/10/2022    No past surgical history on file.  No family history on file.  Social History   Socioeconomic History   Marital status: Divorced    Spouse name: Not on file   Number of children: Not on file   Years of education: Not on file   Highest education level: Some college, no degree  Occupational History   Not on file  Tobacco Use   Smoking status: Never   Smokeless tobacco: Never  Substance and Sexual Activity   Alcohol use: No   Drug use: Not Currently   Sexual activity: Not Currently    Birth control/protection: Surgical  Other Topics Concern   Not on file  Social History Narrative   Not on file   Social Determinants of Health   Financial Resource Strain: Medium Risk (09/19/2022)   Overall Financial Resource Strain (CARDIA)    Difficulty of Paying Living Expenses: Somewhat hard  Food Insecurity: Food Insecurity Present (09/19/2022)   Hunger Vital Sign    Worried About Running Out of Food in the Last Year: Sometimes true    Ran Out of Food in the Last Year: Sometimes true  Transportation Needs: Unmet Transportation Needs (09/19/2022)   PRAPARE - Transportation    Lack of Transportation (Medical): Yes    Lack of Transportation (Non-Medical): Yes  Physical Activity: Unknown (09/19/2022)   Exercise Vital Sign    Days of Exercise per Week: Patient declined    Minutes of Exercise per Session: Not on file  Stress: Stress Concern Present (09/19/2022)   [employer] of Occupational Health - Occupational Stress Questionnaire    Feeling of Stress : Rather much  Social Connections: Socially Isolated (  09/19/2022)   Social Connection and Isolation Panel [NHANES]    Frequency of Communication with Friends and Family: Once a week    Frequency of Social Gatherings with Friends and Family: Never    Attends  Religious Services: Never    Diplomatic Services operational officer: No    Attends Engineer, structural: Not on file    Marital Status: Divorced  Catering manager Violence: Not on file    Allergies  Allergen Reactions   Olmesartan Other (See Comments)    skin turn orange, scalp tingling, worsened BP    Current Outpatient Medications  Medication Sig Dispense Refill   amLODipine (NORVASC) 10 MG tablet Take 1 tablet (10 mg total) by mouth daily. 90 tablet 1   amoxicillin-clavulanate (AUGMENTIN) 875-125 MG tablet Take 1 tablet by mouth 2 (two) times daily for 7 days. 14 tablet 0   escitalopram (LEXAPRO) 10 MG tablet Take 1 tablet (10 mg total) by mouth daily. 90 tablet 1   furosemide (LASIX) 20 MG tablet Take 1-2 tablets (20-40 mg total) by mouth daily. 90 tablet 1   metoprolol succinate (TOPROL-XL) 25 MG 24 hr tablet Take 1 tablet (25 mg total) by mouth at bedtime. 90 tablet 1   naproxen (NAPROSYN) 500 MG tablet Take 1 tablet (500 mg total) by mouth 2 (two) times daily with a meal. for pain. 60 tablet 0   potassium chloride SA (KLOR-CON M) 10 MEQ tablet Take 1 tablet (10 mEq total) by mouth daily. Take daily with Lasix, if you take 2 pills of Lasix, take 2 of these. 90 tablet 1   No current facility-administered medications for this visit.    PHYSICAL EXAM There were no vitals filed for this visit.  Constitutional: *** appearing. *** distress. Appears *** nourished.  Neurologic: CN ***. *** focal findings. *** sensory loss. Psychiatric: *** Mood and affect symmetric and appropriate. Eyes: *** No icterus. No conjunctival pallor. Ears, nose, throat: *** mucous membranes moist. Midline trachea.  Cardiac: *** rate and rhythm.  Respiratory: *** unlabored. Abdominal: *** soft, non-tender, non-distended.  Peripheral vascular: *** Extremity: *** edema. *** cyanosis. *** pallor.  Skin: *** gangrene. *** ulceration.  Lymphatic: *** Stemmer's sign. *** palpable  lymphadenopathy.    PERTINENT LABORATORY AND RADIOLOGIC DATA  Most recent CBC    Latest Ref Rng & Units 07/08/2022   12:57 PM 09/28/2021    6:30 PM 09/23/2021    3:31 PM  CBC  WBC 4.0 - 10.5 K/uL 10.0  10.3  9.6   Hemoglobin 12.0 - 15.0 g/dL 16.1  09.6  04.5   Hematocrit 36.0 - 46.0 % 35.3  37.4  39.9   Platelets 150 - 400 K/uL 282  293  318      Most recent CMP    Latest Ref Rng & Units 07/08/2022   12:57 PM 11/11/2021    4:29 PM 09/28/2021    6:30 PM  CMP  Glucose 70 - 99 mg/dL 409  82  99   BUN 6 - 20 mg/dL 14  18  19    Creatinine 0.44 - 1.00 mg/dL 8.11  9.14  7.82   Sodium 135 - 145 mmol/L 139  138  138   Potassium 3.5 - 5.1 mmol/L 3.6  3.7  4.2   Chloride 98 - 111 mmol/L 105  106  106   CO2 22 - 32 mmol/L 23  25  23    Calcium 8.9 - 10.3 mg/dL 9.5  9.0  9.2  Total Protein 6.5 - 8.1 g/dL 7.1     Total Bilirubin 0.3 - 1.2 mg/dL 0.4     Alkaline Phos 38 - 126 U/L 58     AST 15 - 41 U/L 13     ALT 0 - 44 U/L 14       Renal function CrCl cannot be calculated (Patient's most recent lab result is older than the maximum 21 days allowed.).  No results found for: "HGBA1C"  No results found for: "LDLCALC", "LDLC", "HIRISKLDL", "POCLDL", "LDLDIRECT", "REALLDLC", "TOTLDLC"   Vascular Imaging: ***  Lebert Lovern N. Lenell Antu, MD FACS [facility] Phone Number: [phone] 12/25/2022 4:09 PM   Total time spent on preparing this encounter including chart review, data review, collecting history, examining the patient, coordinating care for this {tnhtimebilling:26202}  Portions of this report may have been transcribed using voice recognition software.  Every effort has been made to ensure accuracy; however, inadvertent computerized transcription errors may still be present.

## 2022-12-26 ENCOUNTER — Encounter: Payer: BC Managed Care – PPO | Admitting: Vascular Surgery

## 2022-12-26 ENCOUNTER — Other Ambulatory Visit (INDEPENDENT_AMBULATORY_CARE_PROVIDER_SITE_OTHER): Payer: BC Managed Care – PPO

## 2022-12-26 DIAGNOSIS — I10 Essential (primary) hypertension: Secondary | ICD-10-CM

## 2022-12-27 ENCOUNTER — Emergency Department (HOSPITAL_BASED_OUTPATIENT_CLINIC_OR_DEPARTMENT_OTHER): Payer: BC Managed Care – PPO

## 2022-12-27 ENCOUNTER — Other Ambulatory Visit: Payer: Self-pay

## 2022-12-27 ENCOUNTER — Encounter: Payer: Self-pay | Admitting: Family Medicine

## 2022-12-27 ENCOUNTER — Emergency Department (HOSPITAL_BASED_OUTPATIENT_CLINIC_OR_DEPARTMENT_OTHER)
Admission: EM | Admit: 2022-12-27 | Discharge: 2022-12-27 | Disposition: A | Discharge status: Home / Self Care | Payer: BC Managed Care – PPO | Attending: Emergency Medicine | Admitting: Emergency Medicine

## 2022-12-27 ENCOUNTER — Encounter (HOSPITAL_BASED_OUTPATIENT_CLINIC_OR_DEPARTMENT_OTHER): Payer: Self-pay | Admitting: Emergency Medicine

## 2022-12-27 ENCOUNTER — Ambulatory Visit (INDEPENDENT_AMBULATORY_CARE_PROVIDER_SITE_OTHER): Payer: BC Managed Care – PPO | Admitting: Family Medicine

## 2022-12-27 VITALS — BP 154/92 | HR 107 | Temp 98.7°F | Ht 68.0 in | Wt 330.1 lb

## 2022-12-27 DIAGNOSIS — Z86718 Personal history of other venous thrombosis and embolism: Secondary | ICD-10-CM | POA: Diagnosis not present

## 2022-12-27 DIAGNOSIS — Z79899 Other long term (current) drug therapy: Secondary | ICD-10-CM | POA: Diagnosis not present

## 2022-12-27 DIAGNOSIS — J40 Bronchitis, not specified as acute or chronic: Secondary | ICD-10-CM | POA: Diagnosis not present

## 2022-12-27 DIAGNOSIS — R0602 Shortness of breath: Secondary | ICD-10-CM

## 2022-12-27 DIAGNOSIS — R000 Tachycardia, unspecified: Secondary | ICD-10-CM | POA: Insufficient documentation

## 2022-12-27 DIAGNOSIS — R051 Acute cough: Secondary | ICD-10-CM

## 2022-12-27 DIAGNOSIS — I10 Essential (primary) hypertension: Secondary | ICD-10-CM | POA: Diagnosis not present

## 2022-12-27 DIAGNOSIS — R600 Localized edema: Secondary | ICD-10-CM | POA: Insufficient documentation

## 2022-12-27 DIAGNOSIS — R161 Splenomegaly, not elsewhere classified: Secondary | ICD-10-CM | POA: Diagnosis not present

## 2022-12-27 DIAGNOSIS — R519 Headache, unspecified: Secondary | ICD-10-CM

## 2022-12-27 DIAGNOSIS — R0789 Other chest pain: Secondary | ICD-10-CM

## 2022-12-27 DIAGNOSIS — R059 Cough, unspecified: Secondary | ICD-10-CM | POA: Diagnosis not present

## 2022-12-27 DIAGNOSIS — K8020 Calculus of gallbladder without cholecystitis without obstruction: Secondary | ICD-10-CM | POA: Diagnosis not present

## 2022-12-27 LAB — BASIC METABOLIC PANEL
Anion gap: 12 (ref 5–15)
BUN: 22 mg/dL (ref 6–23)
BUN: 20 mg/dL (ref 6–20)
CO2: 22 mEq/L (ref 19–32)
CO2: 23 mmol/L (ref 22–32)
Calcium: 9.7 mg/dL (ref 8.4–10.5)
Calcium: 9.9 mg/dL (ref 8.9–10.3)
Chloride: 100 mEq/L (ref 96–112)
Chloride: 100 mmol/L (ref 98–111)
Creatinine, Ser: 1.12 mg/dL (ref 0.40–1.20)
Creatinine, Ser: 0.97 mg/dL (ref 0.44–1.00)
GFR, Estimated: >60 mL/min (ref >60)
GFR: 58.13 mL/min — ABNORMAL LOW (ref >60.00)
Glucose, Bld: 87 mg/dL (ref 70–99)
Glucose, Bld: 90 mg/dL (ref 70–99)
Potassium: 4.4 mEq/L (ref 3.5–5.1)
Potassium: 4.1 mmol/L (ref 3.5–5.1)
Sodium: 133 mEq/L — ABNORMAL LOW (ref 135–145)
Sodium: 135 mmol/L (ref 135–145)

## 2022-12-27 LAB — CBC
HCT: 38.7 % (ref 36.0–46.0)
Hemoglobin: 12.4 g/dL (ref 12.0–15.0)
MCH: 24.3 pg — ABNORMAL LOW (ref 26.0–34.0)
MCHC: 32 g/dL (ref 30.0–36.0)
MCV: 75.7 fL — ABNORMAL LOW (ref 80.0–100.0)
Platelets: 459 10*3/uL — ABNORMAL HIGH (ref 150–400)
RBC: 5.11 MIL/uL (ref 3.87–5.11)
RDW: 16 % — ABNORMAL HIGH (ref 11.5–15.5)
WBC: 10.9 10*3/uL — ABNORMAL HIGH (ref 4.0–10.5)
nRBC: 0 % (ref 0.0–0.2)

## 2022-12-27 LAB — BRAIN NATRIURETIC PEPTIDE: B Natriuretic Peptide: 14.2 pg/mL (ref 0.0–100.0)

## 2022-12-27 LAB — TROPONIN I (HIGH SENSITIVITY)
Troponin I (High Sensitivity): <2 ng/L (ref <18)
Troponin I (High Sensitivity): <2 ng/L (ref <18)

## 2022-12-27 MED ORDER — DOXYCYCLINE HYCLATE 100 MG PO CAPS: 100.0000 mg | ORAL_CAPSULE | Freq: Two times a day (BID) | ORAL | 0 refills | Status: DC

## 2022-12-27 MED ORDER — PREDNISONE 20 MG PO TABS
40.0000 mg | ORAL_TABLET | Freq: Once | ORAL | Status: AC
  Administered 2022-12-27: 40 mg via ORAL
  Filled 2022-12-27: qty 2

## 2022-12-27 MED ORDER — ALBUTEROL SULFATE HFA 108 (90 BASE) MCG/ACT IN AERS
2.0000 | INHALATION_SPRAY | RESPIRATORY_TRACT | Status: DC | PRN
  Administered 2022-12-27: 2 via RESPIRATORY_TRACT
  Filled 2022-12-27: qty 6.7

## 2022-12-27 MED ORDER — AEROCHAMBER PLUS FLO-VU MISC
1.0000 | Freq: Once | Status: AC
  Administered 2022-12-27: 1
  Filled 2022-12-27: qty 1

## 2022-12-27 MED ORDER — PREDNISONE 20 MG PO TABS: 40.0000 mg | ORAL_TABLET | Freq: Every day | ORAL | 0 refills | Status: DC

## 2022-12-27 MED ORDER — IOHEXOL 350 MG/ML SOLN
100.0000 mL | Freq: Once | INTRAVENOUS | Status: AC | PRN
  Administered 2022-12-27: 100 mL via INTRAVENOUS

## 2022-12-27 NOTE — Progress Notes (Signed)
Acute Office Visit   Subjective:  Patient ID: Aleia Morrobel, female    DOB: 28-May-1975, 47 y.o.   MRN: [id number]  Chief Complaint  Patient presents with   Headache   Cough   itching across chect    Pt reports she has cough,itching,headache. Pt reports yellow jacket sting on Friday.    Headache  Associated symptoms include coughing.  Cough Associated symptoms include headaches.   Patient is a 47 year old female that presents with an acute visit. She is complaining of productive cough, itching across chest, occipital headache, thoracic chest pain, and short of breath at rest and exertion. She was seen at this office, Nature conservation officer at [facility], on 12/19/2022 for cough, SHOB, and headache. She was negative for covid, RSV, and influenza. Prescribed Augmentin for 7 days and Promethazine-DM for acute upper respiratory infection. She reports she completed those medications. She reports her coughing is worse in the morning and at night time and didn't improve. She reports when she starts coughing, she feels like she is going to pass out and has a "head rush", "like the blood is rushing back to her head". She reports her hoarseness improved. She denies dizziness or blurry vision.   Patients blood pressure is elevated today. She reports she took her Amlodipine 10mg  and Metoprolol Succinate 25 mg today already. She reports her last monitored her blood pressure when she was at this office and it was stable.  BP Readings from Last 3 Encounters:  12/27/22 (!) 154/92  12/19/22 134/78  12/12/22 121/79    Review of Systems  Respiratory:  Positive for cough.   Neurological:  Positive for headaches.  See HPI above      Objective:   BP (!) 154/92 (BP Location: Right Arm, Cuff Size: Large)   Pulse (!) 107   Temp 98.7 F (37.1 C)   Ht 5\' 8"  (1.727 m)   Wt (!) 330 lb 2 oz (149.7 kg)   SpO2 98%   BMI 50.20 kg/m    Physical Exam Vitals reviewed.  Constitutional:       General: She is not in acute distress.    Appearance: Normal appearance. She is obese. She is not ill-appearing, toxic-appearing or diaphoretic.  HENT:     Head: Normocephalic and atraumatic.  Eyes:     General:        Right eye: No discharge.        Left eye: No discharge.     Conjunctiva/sclera: Conjunctivae normal.  Cardiovascular:     Rate and Rhythm: Normal rate and regular rhythm.     Heart sounds: Normal heart sounds. No murmur heard.    No friction rub. No gallop.  Pulmonary:     Effort: Pulmonary effort is normal. No respiratory distress.     Breath sounds: Decreased air movement present.     Comments: Cough, heavy breathing noted  Musculoskeletal:        General: Normal range of motion.  Skin:    General: Skin is warm and dry.  Neurological:     General: No focal deficit present.     Mental Status: She is alert and oriented to person, place, and time. Mental status is at baseline.     Gait: Gait normal.  Psychiatric:        Mood and Affect: Mood normal.        Speech: Speech normal.        Behavior: Behavior normal.  Thought Content: Thought content normal.        Judgment: Judgment normal.   EKG completed today due to chest pain, SHOB, and elevated blood pressure. Sinus tachy with no ST elevation. Similar EKG with comparison to her last EKG on 10/07/2021.     Assessment & Plan:  Shortness of breath  Other chest pain  Acute cough  History of DVT (deep vein thrombosis)  Essential hypertension  Acute nonintractable headache, unspecified headache type  -Recommend patient go directly to the closes emergency department, [place] [facility] for shortness of breath at rest and on exertion, thoracic chest pain, elevated blood pressure with taking her blood pressure medication today, tachycardia, headache, and history of DVT.  -Concerned about infection or possible PE. Patient reports she has had a history of DVT and was on blood thinners for about 8  months.  -Offered to call 911, call someone to drive her to the hospital, she declined and reported she could drive herself directly to the emergency department. Informed patient if her symptoms become worse while driving, pull over, and call 911.   Zandra Abts, NP

## 2022-12-27 NOTE — ED Provider Notes (Signed)
[place] EMERGENCY DEPARTMENT AT [facility] Provider Note   CSN: [id number] Arrival date & time: 12/27/22  1627     History  Chief Complaint  Patient presents with   Shortness of Breath    Emily Roy is a 47 y.o. female.  Patient with history of DVT, thought to be provoked due to a long car ride, completed approximately 8 months of anticoagulation at that time --presents today for ongoing productive cough, tightness across her chest, headache, and shortness of breath at rest and exertion.  She states that the shortness of breath has been making it difficult for her to do certain tasks and some that she needs to stop and catch her breath before completing her work.  She was seen by PCP on 12/19/2022 for cough, SHOB, and headache. She was negative for covid, RSV, and influenza. Prescribed Augmentin for 7 days and Promethazine-DM for acute upper respiratory infection. She reports she completed those medications.  Her symptoms have persisted, prompting PCP visit today.  She was then sent for evaluation and rule out of PE.  Patient denies any acute or worsening thigh or lower extremity pains or swelling.  She does take Lasix for lower extremity edema, no reported history of heart failure.  Cough is productive, green sputum without blood.  She does have wheezing consistent with history of bronchitis. Patient denies risk factors for pulmonary embolism including: unilateral leg swelling, use of exogenous hormones, recent immobilizations, recent surgery, recent travel (>4hr segment), malignancy, hemoptysis.  She has a history of hypertension, no history of high cholesterol or diabetes.  She does not smoke or use tobacco.  States that father passed from heart disease in his 38s.         Home Medications Prior to Admission medications   Medication Sig Start Date End Date Taking? Authorizing Provider  amLODipine (NORVASC) 10 MG tablet Take 1 tablet (10 mg total) by mouth  daily. 09/19/22   Dulce Sellar, NP  escitalopram (LEXAPRO) 10 MG tablet Take 1 tablet (10 mg total) by mouth daily. 09/19/22   Dulce Sellar, NP  furosemide (LASIX) 20 MG tablet Take 1-2 tablets (20-40 mg total) by mouth daily. 12/12/22   Dulce Sellar, NP  metoprolol succinate (TOPROL-XL) 25 MG 24 hr tablet Take 1 tablet (25 mg total) by mouth at bedtime. 09/19/22   Dulce Sellar, NP  naproxen (NAPROSYN) 500 MG tablet Take 1 tablet (500 mg total) by mouth 2 (two) times daily with a meal. for pain. 09/19/22   Dulce Sellar, NP  potassium chloride SA (KLOR-CON M) 10 MEQ tablet Take 1 tablet (10 mEq total) by mouth daily. Take daily with Lasix, if you take 2 pills of Lasix, take 2 of these. 12/12/22   Dulce Sellar, NP      Allergies    Olmesartan    Review of Systems   Review of Systems  Physical Exam Updated Vital Signs BP (!) 134/120 (BP Location: Right Arm)   Pulse (!) 117   Temp (!) 97.3 F (36.3 C)   Resp (!) 24   LMP 10/27/2022 (Approximate)   SpO2 95%  Physical Exam Vitals and nursing note reviewed.  Constitutional:      Appearance: She is well-developed. She is not diaphoretic.  HENT:     Head: Normocephalic and atraumatic.     Mouth/Throat:     Mouth: Mucous membranes are not dry.  Eyes:     Conjunctiva/sclera: Conjunctivae normal.  Neck:     Vascular: Normal carotid pulses.  No JVD.     Trachea: Trachea normal. No tracheal deviation.  Cardiovascular:     Rate and Rhythm: Regular rhythm. Tachycardia present.     Pulses: No decreased pulses.          Radial pulses are 2+ on the right side and 2+ on the left side.     Heart sounds: Normal heart sounds, S1 normal and S2 normal. No murmur heard. Pulmonary:     Effort: Pulmonary effort is normal. No respiratory distress.     Breath sounds: No wheezing, rhonchi or rales.  Chest:     Chest wall: No tenderness.  Abdominal:     General: Bowel sounds are normal.     Palpations: Abdomen is soft.      Tenderness: There is no abdominal tenderness. There is no guarding or rebound.  Musculoskeletal:        General: Normal range of motion.     Cervical back: Normal range of motion and neck supple. No muscular tenderness.     Right lower leg: No tenderness. Edema present.     Left lower leg: No tenderness. Edema present.  Skin:    General: Skin is warm and dry.     Coloration: Skin is not pale.  Neurological:     Mental Status: She is alert.     ED Results / Procedures / Treatments   Labs (all labs ordered are listed, but only abnormal results are displayed) Labs Reviewed  CBC - Abnormal; Notable for the following components:      Result Value   WBC 10.9 (*)    MCV 75.7 (*)    MCH 24.3 (*)    RDW 16.0 (*)    Platelets 459 (*)    All other components within normal limits  BASIC METABOLIC PANEL  BRAIN NATRIURETIC PEPTIDE  TROPONIN I (HIGH SENSITIVITY)  TROPONIN I (HIGH SENSITIVITY)    ED ECG REPORT   Date: 12/27/2022  Rate: 113  Rhythm: sinus tachycardia  QRS Axis: right  Intervals: normal  ST/T Wave abnormalities: nonspecific T wave changes  Conduction Disutrbances:none  Narrative Interpretation:   Old EKG Reviewed: changes noted  I have personally reviewed the EKG tracing and agree with the computerized printout as noted.   Radiology CT Angio Chest PE W and/or Wo Contrast  Result Date: 12/27/2022 CLINICAL DATA:  One-week history of cough and shortness of breath with tachypnea and tachycardia. Remote history of DVT. EXAM: CT ANGIOGRAPHY CHEST WITH CONTRAST TECHNIQUE: Multidetector CT imaging of the chest was performed using the standard protocol during bolus administration of intravenous contrast. Multiplanar CT image reconstructions and MIPs were obtained to evaluate the vascular anatomy. RADIATION DOSE REDUCTION: This exam was performed according to the departmental dose-optimization program which includes automated exposure control, adjustment of the mA and/or kV  according to patient size and/or use of iterative reconstruction technique. CONTRAST:  OMNIPAQUE IOHEXOL 350 MG/ML SOLN COMPARISON:  CTA chest dated 09/23/2021 FINDINGS: Cardiovascular: The study is adequate for the evaluation of pulmonary embolism to the level of the proximal subsegmental arteries. There are no filling defects in the central, lobar, segmental or proximal subsegmental pulmonary artery branches to suggest acute pulmonary embolism. Great vessels are normal in course and caliber. Normal heart size. No significant pericardial fluid/thickening. Mediastinum/Nodes: Imaged thyroid gland without nodules meeting criteria for imaging follow-up by size. Normal esophagus. No pathologically enlarged axillary, supraclavicular, mediastinal, or hilar lymph nodes. Calcified left hilar lymph nodes, likely sequela of prior granulomatous infection. Lungs/Pleura: The  central airways are patent. No focal consolidation. No pneumothorax. No pleural effusion. Upper abdomen: Unchanged enlarged spleen in the AP dimension measures 14.3 cm. Cholelithiasis. Diffuse parenchymal hypoattenuation can be seen with hepatic steatosis. Musculoskeletal: No acute or abnormal lytic or blastic osseous lesions. Review of the MIP images confirms the above findings. IMPRESSION: 1. No evidence of pulmonary embolism. 2. No acute intrathoracic process. 3. Unchanged splenomegaly. 4. Cholelithiasis. 5. Hepatic steatosis. Electronically Signed   By: Agustin Cree M.D.   On: 12/27/2022 18:41   DG Chest Port 1 View  Result Date: 12/27/2022 CLINICAL DATA:  One-week history of shortness of breath and cough with tachypnea and tachycardia EXAM: PORTABLE CHEST 1 VIEW COMPARISON:  Chest radiograph dated 09/23/2021 FINDINGS: Normal lung volumes. No focal consolidations. No pleural effusion or pneumothorax. The heart size and mediastinal contours are within normal limits. No acute osseous abnormality. IMPRESSION: No active disease. Electronically Signed    By: Agustin Cree M.D.   On: 12/27/2022 18:34    Procedures Procedures    Medications Ordered in ED Medications - No data to display  ED Course/ Medical Decision Making/ A&P    Patient seen and examined. History obtained directly from patient.   Labs/EKG: Ordered CBC, BMP, troponin, BNP, EKG.  Imaging: Ordered portable chest, CT scan of the chest to evaluate for possibility of PE.  Medications/Fluids: Ordered: None ordered.   Most recent vital signs reviewed and are as follows: BP (!) 134/120 (BP Location: Right Arm)   Pulse (!) 117   Temp (!) 97.3 F (36.3 C)   Resp (!) 24   LMP 10/27/2022 (Approximate)   SpO2 95%   Initial impression: Cough or shortness of breath, history of DVT, rule out PE.  Also possibility of infectious etiology.  8:25 PM Reassessment performed. Patient appears stable during several rechecks.  Heart rate on recheck have been 98-105.  Labs personally reviewed and interpreted including: CBC with normal hemoglobin, white blood cell count minimally elevated at 10.9, microcytic red blood cells noted; BMP unremarkable with normal kidney function, troponin less than 2 stable; BNP was normal.  Imaging personally visualized and interpreted including: Chest x-ray agree no active disease; CT angio of the chest, agree no PE or pneumonia.  Reviewed pertinent lab work and imaging with patient at bedside.  Patient was made aware of cholelithiasis and hepatic steatosis which have been previously noted.  Questions answered.  Encouraged her to avoid alcohol and to follow-up with her doctor for monitoring and possibly further evaluation.  Patient reports that mother died of liver disease, cirrhosis, did have a history of alcohol use.  She was nervous about these findings.  Most current vital signs reviewed and are as follows: BP 132/82   Pulse (!) 116 Comment: Ambulating  Temp (!) 97.3 F (36.3 C)   Resp (!) 24   LMP 10/27/2022 (Approximate)   SpO2 97%   Plan:  Discharge to home.   Prescriptions written for: Prednisone, doxycycline.  I did discuss trial of additional antibiotic, likely limited benefit given no improvement with amoxicillin.  Patient would like to try another medication in addition to the prednisone and albuterol provided today.  Other home care instructions discussed: Rest, OTC meds  ED return instructions discussed: Worsening chest pain, shortness of breath, syncope, new or worsening symptoms  Follow-up instructions discussed: Patient encouraged to follow-up with their PCP in 2 days.  Medical Decision Making Amount and/or Complexity of Data Reviewed Labs: ordered. Radiology: ordered.  Risk Prescription drug management.   Patient with ongoing bronchitis symptoms including productive cough, wheezing.  She has chronic tachycardia and takes metoprolol for this.  She was tachycardic today with persistent shortness of breath and was sent in to evaluate for PE given history of DVT.  No other obvious provoking factors here.  CT of the chest did not demonstrate PE.  No signs of heart strain with normal troponin and normal BNP.  No signs of pneumonia on CT.  Patient ambulated in the department without hypoxia.  She has remained mildly tachycardic during ED stay, upper 90s to low 100s.  The patient's vital signs, pertinent lab work and imaging were reviewed and interpreted as discussed in the ED course. Hospitalization was considered for further testing, treatments, or serial exams/observation. However as patient is well-appearing, has a stable exam, and reassuring studies today, I do not feel that they warrant admission at this time. This plan was discussed with the patient who verbalizes agreement and comfort with this plan and seems reliable and able to return to the Emergency Department with worsening or changing symptoms.          Final Clinical Impression(s) / ED Diagnoses Final diagnoses:   Shortness of breath  Sinus tachycardia  Bronchitis    Rx / DC Orders ED Discharge Orders          Ordered    predniSONE (DELTASONE) 20 MG tablet  Daily        12/27/22 2022    doxycycline (VIBRAMYCIN) 100 MG capsule  2 times daily        12/27/22 2022              Renne Crigler, PA-C 12/27/22 2029    Charlynne Pander, MD 12/27/22 [id number]

## 2022-12-27 NOTE — ED Triage Notes (Signed)
Sob breath, cough x 1 week Tachypnea, tachycardia  Abt no relief Seen by pcp concern for PE Hx of DVT in 2015

## 2022-12-27 NOTE — Discharge Instructions (Signed)
Please read and follow all provided instructions.  Your diagnoses today include:  1. Shortness of breath   2. Sinus tachycardia   3. Bronchitis     Tests performed today include: An EKG of your heart: Shows fast heart rate but no abnormal rhythms A chest x-ray/CT: No pneumonia, blood clot or other concerning problems Cardiac enzymes - a blood test for heart muscle damage Blood counts and electrolytes Vital signs. See below for your results today.   Medications prescribed:  Doxycycline - antibiotic  You have been prescribed an antibiotic medicine: take the entire course of medicine even if you are feeling better. Stopping early can cause the antibiotic not to work.  Prednisone - steroid medicine   It is best to take this medication in the morning to prevent sleeping problems. If you are diabetic, monitor your blood sugar closely and stop taking Prednisone if blood sugar is over 300. Take with food to prevent stomach upset.   Albuterol inhaler - medication that opens up your airway  Use inhaler as follows: 1-2 puffs with spacer every 4 hours as needed for wheezing, cough, or shortness of breath.   Take any prescribed medications only as directed.  Follow-up instructions: Please follow-up with your primary care provider as soon as you can for further evaluation of your symptoms.   Return instructions:  SEEK IMMEDIATE MEDICAL ATTENTION IF: You have severe chest pain, especially if the pain is crushing or pressure-like and spreads to the arms, back, neck, or jaw, or if you have sweating, nausea or vomiting, or trouble with breathing. THIS IS AN EMERGENCY. Do not wait to see if the pain will go away. Get medical help at once. Call 911. DO NOT drive yourself to the hospital.  Your chest pain gets worse and does not go away after a few minutes of rest.  You have an attack of chest pain lasting longer than what you usually experience.  You have significant dizziness, if you pass out, or  have trouble walking.  You have chest pain not typical of your usual pain for which you originally saw your caregiver.  You have any other emergent concerns regarding your health.  Additional Information: Chest pain comes from many different causes. Your caregiver has diagnosed you as having chest pain that is not specific for one problem, but does not require admission.  You are at low risk for an acute heart condition or other serious illness.   Your vital signs today were: BP 132/82   Pulse (!) 116 Comment: Ambulating  Temp (!) 97.3 F (36.3 C)   Resp (!) 24   LMP 10/27/2022 (Approximate)   SpO2 97%  If your blood pressure (BP) was elevated above 135/85 this visit, please have this repeated by your doctor within one month. --------------

## 2022-12-27 NOTE — Progress Notes (Signed)
RT evaluated Pt. Pt has a congested cough but her lungs were clear.

## 2022-12-27 NOTE — ED Notes (Signed)
Patient ambulating in hall with pulse oximetry. SpO2 maintained 95-96%. Max HR 116. Patient with mild increase in WOB following walk.

## 2023-01-13 ENCOUNTER — Ambulatory Visit: Payer: BC Managed Care – PPO | Admitting: Family

## 2023-01-13 ENCOUNTER — Encounter: Payer: Self-pay | Admitting: Family

## 2023-01-13 VITALS — BP 139/96 | HR 93 | Temp 98.0°F | Ht 68.0 in | Wt 345.4 lb

## 2023-01-13 DIAGNOSIS — R600 Localized edema: Secondary | ICD-10-CM

## 2023-01-13 DIAGNOSIS — G8929 Other chronic pain: Secondary | ICD-10-CM

## 2023-01-13 DIAGNOSIS — Z6841 Body Mass Index (BMI) 40.0 and over, adult: Secondary | ICD-10-CM

## 2023-01-13 DIAGNOSIS — K760 Fatty (change of) liver, not elsewhere classified: Secondary | ICD-10-CM | POA: Insufficient documentation

## 2023-01-13 DIAGNOSIS — J209 Acute bronchitis, unspecified: Secondary | ICD-10-CM

## 2023-01-13 DIAGNOSIS — I10 Essential (primary) hypertension: Secondary | ICD-10-CM

## 2023-01-13 LAB — CBC WITH DIFFERENTIAL/PLATELET
Absolute Monocytes: 605 {cells}/uL (ref 200–950)
Basophils Absolute: 74 {cells}/uL (ref 0–200)
Basophils Relative: 0.8 %
Eosinophils Absolute: 177 {cells}/uL (ref 15–500)
Eosinophils Relative: 1.9 %
HCT: 36.8 % (ref 35.0–45.0)
Hemoglobin: 11.5 g/dL — ABNORMAL LOW (ref 11.7–15.5)
Lymphs Abs: 2306 {cells}/uL (ref 850–3900)
MCH: 24.6 pg — ABNORMAL LOW (ref 27.0–33.0)
MCHC: 31.3 g/dL — ABNORMAL LOW (ref 32.0–36.0)
MCV: 78.6 fL — ABNORMAL LOW (ref 80.0–100.0)
MPV: 10 fL (ref 7.5–12.5)
Monocytes Relative: 6.5 %
Neutro Abs: 6138 {cells}/uL (ref 1500–7800)
Neutrophils Relative %: 66 %
Platelets: 305 10*3/uL (ref 140–400)
RBC: 4.68 10*6/uL (ref 3.80–5.10)
RDW: 15.8 % — ABNORMAL HIGH (ref 11.0–15.0)
Total Lymphocyte: 24.8 %
WBC: 9.3 10*3/uL (ref 3.8–10.8)

## 2023-01-13 MED ORDER — METOPROLOL SUCCINATE ER 25 MG PO TB24: 25.0000 mg | ORAL_TABLET | Freq: Every day | ORAL | 1 refills | Status: DC

## 2023-01-13 MED ORDER — AIRSUPRA 90-80 MCG/ACT IN AERO: 2.0000 | INHALATION_SPRAY | Freq: Three times a day (TID) | RESPIRATORY_TRACT | Status: DC

## 2023-01-13 MED ORDER — NAPROXEN 500 MG PO TABS: 500.0000 mg | ORAL_TABLET | Freq: Two times a day (BID) | ORAL | 0 refills | Status: DC

## 2023-01-13 MED ORDER — NAPROXEN 500 MG PO TABS: 500.0000 mg | ORAL_TABLET | Freq: Two times a day (BID) | ORAL | 0 refills | Status: AC

## 2023-01-13 MED ORDER — PREDNISONE 20 MG PO TABS
ORAL_TABLET | ORAL | 0 refills | Status: DC
Start: 2023-01-13 — End: 2023-03-13

## 2023-01-13 MED ORDER — AMLODIPINE BESYLATE 10 MG PO TABS: 10.0000 mg | ORAL_TABLET | Freq: Every day | ORAL | 1 refills | Status: DC

## 2023-01-13 NOTE — Progress Notes (Signed)
Patient ID: Emily Roy, female    DOB: Mar 09, 1976, 47 y.o.   MRN: [id number]  Chief Complaint  Patient presents with   Cough    Pt c/o cough and chest tightness , Present for 4 weeks. Pt was seen at [facility] and ED on 12/27/2022. Pt has completed abx and prednisone.    *Discussed the use of AI scribe software for clinical note transcription with the patient, who gave verbal consent to proceed.  History of Present Illness   The patient, with a history of hypertension and obesity, presents with a persistent cough and edema. Despite two rounds of antibiotics (Augmentin x7d, & DOXY x7d on 9/16 & 9/24 respectively) prednisone, and an albuterol inhaler, the cough persists. The cough is described as severe, particularly at night and early in the morning, and is associated with significant mucus production. The cough is so severe that it causes the patient to feel as though she is going to black out. The patient also reports difficulty breathing, describing it as "breathing through a wet washcloth."  Hypertension/Leg swelling: Patient is currently maintained on the following medications for blood pressure: Amlodipine, Lasix, Toprol Failed meds include: Olmesartan, turned skin orange- brown on bilateral arms, then down legs, scalp tingling, stopped med & sx resolved. Patient reports good compliance with blood pressure medications. Patient denies chest pain,  shortness of breath.  Pt has moderate swelling in both legs with varicose veins & spider veins in the past. Pt works as Electrical engineer, mix of sitting, standing & walking. Pt states she has never tried compression socks. She is experiencing significant edema in the legs, despite taking diuretics. The edema is so severe that it is causing cramping, which is only partially relieved by potassium supplementation.   Morbid Obesity:  The patient has chronically struggled with weight gain and is exacerbated by prednisone course, she is considering weight  loss options through [facility] clinic in town.     Assessment & Plan:     Persistent Cough - Lungs with mild rhonchi in bases. Despite two rounds of antibiotics, prednisone, and an inhaler, the patient continues to experience a severe cough with mucus production, particularly at night and in the morning.  Chest X-ray and CT lung scan were normal in ER 2 w ago. -Ordering a CBC to check for infection. -Referring to pulmonology for further evaluation. -Provided Airsupra inhaler sample to be used three times a day, 2 puffs. -Sending Prednisone 40mg  x3d, 20mg  x 2d refill. -Continue use of albuterol inhaler as needed.  Edema - Despite taking diuretics, the patient reports significant leg swelling and water retention. The patient also reports cramping, which is somewhat alleviated by potassium supplementation. -Reports initial cardiology & Vascular appts are on 10/22 for further evaluation. -Continue current diuretic regimen, with potassium supplementation, drinking 2L water daily, low sodium diet.  Hypertension - The patient is currently on amlodipine and metoprolol for hypertension management. -BP good today. -Refill prescriptions for amlodipine and metoprolol. -F/U 6mos  Weight Gain - The patient has experienced weight gain, which may be contributing to her health issues. The patient is considering weight loss options, including medication and consultation with a weight loss specialist [facility]). -Advise the patient to check with her insurance about coverage for weight loss medications. -Consider prescribing weight loss medication if insurance coverage is confirmed and if deemed appropriate after further evaluation.      Subjective:    Outpatient Medications Prior to Visit  Medication Sig Dispense Refill   amLODipine (  NORVASC) 10 MG tablet Take 1 tablet (10 mg total) by mouth daily. 90 tablet 1   escitalopram (LEXAPRO) 10 MG tablet Take 1 tablet (10 mg total) by mouth daily. 90 tablet  1   furosemide (LASIX) 20 MG tablet Take 1-2 tablets (20-40 mg total) by mouth daily. 90 tablet 1   metoprolol succinate (TOPROL-XL) 25 MG 24 hr tablet Take 1 tablet (25 mg total) by mouth at bedtime. 90 tablet 1   naproxen (NAPROSYN) 500 MG tablet Take 1 tablet (500 mg total) by mouth 2 (two) times daily with a meal. for pain. 60 tablet 0   potassium chloride SA (KLOR-CON M) 10 MEQ tablet Take 1 tablet (10 mEq total) by mouth daily. Take daily with Lasix, if you take 2 pills of Lasix, take 2 of these. 90 tablet 1   doxycycline (VIBRAMYCIN) 100 MG capsule Take 1 capsule (100 mg total) by mouth 2 (two) times daily. (Patient not taking: Reported on 01/13/2023) 14 capsule 0   predniSONE (DELTASONE) 20 MG tablet Take 2 tablets (40 mg total) by mouth daily. (Patient not taking: Reported on 01/13/2023) 8 tablet 0   No facility-administered medications prior to visit.   Past Medical History:  Diagnosis Date   Acute DVT (deep venous thrombosis) (HCC) 07/01/2013   Dysmenorrhea 09/29/2005   Formatting of this note might be different from the original. spotting   Fatigue 09/03/2007   Hypertension    Long term (current) use of anticoagulants 07/05/2013   Persistent headaches 01/10/2022   No past surgical history on file. Allergies  Allergen Reactions   Olmesartan Other (See Comments)    skin turn orange, scalp tingling, worsened BP      Objective:    Physical Exam Vitals and nursing note reviewed.  Constitutional:      Appearance: Normal appearance. She is morbidly obese.  Cardiovascular:     Rate and Rhythm: Normal rate and regular rhythm.     Comments: pt has several varicose veins on bilateral legs Pulmonary:     Effort: Pulmonary effort is normal.     Breath sounds: Examination of the right-lower field reveals rhonchi. Examination of the left-lower field reveals rhonchi. Rhonchi (mild) present.  Musculoskeletal:        General: Normal range of motion.     Right lower leg: Edema  present.     Left lower leg: Edema present.  Skin:    General: Skin is warm and dry.  Neurological:     Mental Status: She is alert.  Psychiatric:        Mood and Affect: Mood normal.        Behavior: Behavior normal.    BP (!) 139/96 (BP Location: Left Arm, Patient Position: Sitting, Cuff Size: Large)   Pulse 93   Temp 98 F (36.7 C) (Temporal)   Ht 5\' 8"  (1.727 m)   Wt (!) 345 lb 6 oz (156.7 kg)   LMP 10/27/2022 (Approximate)   SpO2 96%   BMI 52.51 kg/m  Wt Readings from Last 3 Encounters:  01/13/23 (!) 345 lb 6 oz (156.7 kg)  12/27/22 (!) 330 lb 2 oz (149.7 kg)  12/19/22 (!) 334 lb 8 oz (151.7 kg)      Dulce Sellar, NP

## 2023-01-16 ENCOUNTER — Other Ambulatory Visit: Payer: Self-pay | Admitting: *Deleted

## 2023-01-16 DIAGNOSIS — R600 Localized edema: Secondary | ICD-10-CM

## 2023-01-17 DIAGNOSIS — E559 Vitamin D deficiency, unspecified: Secondary | ICD-10-CM | POA: Diagnosis not present

## 2023-01-17 DIAGNOSIS — R7309 Other abnormal glucose: Secondary | ICD-10-CM | POA: Diagnosis not present

## 2023-01-17 DIAGNOSIS — I10 Essential (primary) hypertension: Secondary | ICD-10-CM | POA: Diagnosis not present

## 2023-01-17 DIAGNOSIS — Z6841 Body Mass Index (BMI) 40.0 and over, adult: Secondary | ICD-10-CM | POA: Diagnosis not present

## 2023-01-17 DIAGNOSIS — Z1331 Encounter for screening for depression: Secondary | ICD-10-CM | POA: Diagnosis not present

## 2023-01-17 DIAGNOSIS — E611 Iron deficiency: Secondary | ICD-10-CM | POA: Diagnosis not present

## 2023-01-17 DIAGNOSIS — N951 Menopausal and female climacteric states: Secondary | ICD-10-CM | POA: Diagnosis not present

## 2023-01-17 DIAGNOSIS — E782 Mixed hyperlipidemia: Secondary | ICD-10-CM | POA: Diagnosis not present

## 2023-01-19 ENCOUNTER — Ambulatory Visit: Payer: BC Managed Care – PPO | Admitting: Family

## 2023-01-23 NOTE — Progress Notes (Deleted)
[facility] [place]  ASSESSMENT / PLAN: Emily Roy is a 47 y.o. female with chronic venous insufficiency of *** lower extremity causing *** (C*** disease).  Venous duplex is significant for *** Recommend compression and elevation for symptomatic relief. ***Follow up with me in three months to discuss saphenous vein ablation. ***Follow up with me as needed.   CHIEF COMPLAINT: ***  HISTORY OF PRESENT ILLNESS: Emily Roy is a 47 y.o. female ***  VASCULAR SURGICAL HISTORY: ***  VASCULAR RISK FACTORS: {FINDINGS; POSITIVE NEGATIVE:769-132-3318} history of stroke / transient ischemic attack. {FINDINGS; POSITIVE NEGATIVE:769-132-3318} history of coronary artery disease. *** history of PCI. *** history of CABG.  {FINDINGS; POSITIVE NEGATIVE:[id number]} history of diabetes mellitus. Last A1c ***. {FINDINGS; POSITIVE NEGATIVE:[id number]} history of smoking. *** actively smoking. {FINDINGS; POSITIVE NEGATIVE:[id number]} history of hypertension. *** drug regimen with *** control. {FINDINGS; POSITIVE NEGATIVE:[id number]} history of chronic kidney disease.  Last GFR ***. CKD {stage:30421363}. {FINDINGS; POSITIVE NEGATIVE:[id number]} history of chronic obstructive pulmonary disease, treated with ***.  FUNCTIONAL STATUS: ECOG performance status: {findings; ecog performance status:31780} Ambulatory status: {TNHAmbulation:25868}  CAREY 1 AND 3 YEAR INDEX Female (2pts) 75-79 or 80-84 (2pts) >84 (3pts) Dependence in toileting (1pt) Partial or full dependence in dressing (1pt) History of malignant neoplasm (2pts) CHF (3pts) COPD (1pts) CKD (3pts)  0-3 pts 6% 1 year mortality ; 21% 3 year mortality 4-5 pts 12% 1 year mortality ; 36% 3 year mortality >5 pts 21% 1 year mortality; 54% 3 year mortality   Past Medical History:  Diagnosis Date   Acute DVT (deep venous thrombosis) (HCC) 07/01/2013   Dysmenorrhea 09/29/2005   Formatting of this note  might be different from the original. spotting   Fatigue 09/03/2007   Hypertension    Long term (current) use of anticoagulants 07/05/2013   Persistent headaches 01/10/2022    No past surgical history on file.  No family history on file.  Social History   Socioeconomic History   Marital status: Divorced    Spouse name: Not on file   Number of children: Not on file   Years of education: Not on file   Highest education level: Some college, no degree  Occupational History   Not on file  Tobacco Use   Smoking status: Never   Smokeless tobacco: Never  Substance and Sexual Activity   Alcohol use: No   Drug use: Not Currently   Sexual activity: Not Currently    Birth control/protection: Surgical  Other Topics Concern   Not on file  Social History Narrative   Not on file   Social Determinants of Health   Financial Resource Strain: Medium Risk (09/19/2022)   Overall Financial Resource Strain (CARDIA)    Difficulty of Paying Living Expenses: Somewhat hard  Food Insecurity: Food Insecurity Present (09/19/2022)   Hunger Vital Sign    Worried About Running Out of Food in the Last Year: Sometimes true    Ran Out of Food in the Last Year: Sometimes true  Transportation Needs: Unmet Transportation Needs (09/19/2022)   PRAPARE - Transportation    Lack of Transportation (Medical): Yes    Lack of Transportation (Non-Medical): Yes  Physical Activity: Unknown (09/19/2022)   Exercise Vital Sign    Days of Exercise per Week: Patient declined    Minutes of Exercise per Session: Not on file  Stress: Stress Concern Present (09/19/2022)   [employer] of Occupational Health - Occupational Stress Questionnaire    Feeling of Stress : Rather much  Social Connections: Socially Isolated (  09/19/2022)   Social Connection and Isolation Panel [NHANES]    Frequency of Communication with Friends and Family: Once a week    Frequency of Social Gatherings with Friends and Family: Never    Attends  Religious Services: Never    Database administrator or Organizations: No    Attends Engineer, structural: Not on file    Marital Status: Divorced  Catering manager Violence: Not on file    Allergies  Allergen Reactions   Olmesartan Other (See Comments)    skin turn orange, scalp tingling, worsened BP    Current Outpatient Medications  Medication Sig Dispense Refill   Albuterol-Budesonide (AIRSUPRA) 90-80 MCG/ACT AERO Inhale 2 puffs into the lungs in the morning, at noon, and at bedtime.     amLODipine (NORVASC) 10 MG tablet Take 1 tablet (10 mg total) by mouth daily. 90 tablet 1   escitalopram (LEXAPRO) 10 MG tablet Take 1 tablet (10 mg total) by mouth daily. 90 tablet 1   furosemide (LASIX) 20 MG tablet Take 1-2 tablets (20-40 mg total) by mouth daily. 90 tablet 1   metoprolol succinate (TOPROL-XL) 25 MG 24 hr tablet Take 1 tablet (25 mg total) by mouth at bedtime. 90 tablet 1   naproxen (NAPROSYN) 500 MG tablet Take 1 tablet (500 mg total) by mouth 2 (two) times daily with a meal. for pain.DO NOT TAKE WITH PREDNISONE. 60 tablet 0   potassium chloride SA (KLOR-CON M) 10 MEQ tablet Take 1 tablet (10 mEq total) by mouth daily. Take daily with Lasix, if you take 2 pills of Lasix, take 2 of these. 90 tablet 1   predniSONE (DELTASONE) 20 MG tablet Take 2 pills in the morning with breakfast for 3 days, then 1 pill for 2 days 8 tablet 0   No current facility-administered medications for this visit.    PHYSICAL EXAM There were no vitals filed for this visit.  Constitutional: *** appearing. *** distress. Appears *** nourished.  Neurologic: CN ***. *** focal findings. *** sensory loss. Psychiatric: *** Mood and affect symmetric and appropriate. Eyes: *** No icterus. No conjunctival pallor. Ears, nose, throat: *** mucous membranes moist. Midline trachea.  Cardiac: *** rate and rhythm.  Respiratory: *** unlabored. Abdominal: *** soft, non-tender, non-distended.  Peripheral  vascular: *** Extremity: *** edema. *** cyanosis. *** pallor.  Skin: *** gangrene. *** ulceration.  Lymphatic: *** Stemmer's sign. *** palpable lymphadenopathy.    PERTINENT LABORATORY AND RADIOLOGIC DATA  Most recent CBC    Latest Ref Rng & Units 01/13/2023    3:27 PM 12/27/2022    4:51 PM 07/08/2022   12:57 PM  CBC  WBC 3.8 - 10.8 Thousand/uL 9.3  10.9  10.0   Hemoglobin 11.7 - 15.5 g/dL 16.1  09.6  04.5   Hematocrit 35.0 - 45.0 % 36.8  38.7  35.3   Platelets 140 - 400 Thousand/uL 305  459  282      Most recent CMP    Latest Ref Rng & Units 12/27/2022    4:51 PM 12/26/2022    3:35 PM 07/08/2022   12:57 PM  CMP  Glucose 70 - 99 mg/dL 90  87  409   BUN 6 - 20 mg/dL 20  22  14    Creatinine 0.44 - 1.00 mg/dL 8.11  9.14  7.82   Sodium 135 - 145 mmol/L 135  133  139   Potassium 3.5 - 5.1 mmol/L 4.1  4.4  3.6   Chloride 98 - 111  mmol/L 100  100  105   CO2 22 - 32 mmol/L 23  22  23    Calcium 8.9 - 10.3 mg/dL 9.9  9.7  9.5   Total Protein 6.5 - 8.1 g/dL   7.1   Total Bilirubin 0.3 - 1.2 mg/dL   0.4   Alkaline Phos 38 - 126 U/L   58   AST 15 - 41 U/L   13   ALT 0 - 44 U/L   14     Renal function CrCl cannot be calculated (Patient's most recent lab result is older than the maximum 21 days allowed.).  No results found for: "HGBA1C"  No results found for: "LDLCALC", "LDLC", "HIRISKLDL", "POCLDL", "LDLDIRECT", "REALLDLC", "TOTLDLC"   Vascular Imaging: ***  Delainie Chavana N. Lenell Antu, MD FACS [facility] Phone Number: [phone] 01/23/2023 8:35 AM   Total time spent on preparing this encounter including chart review, data review, collecting history, examining the patient, coordinating care for this {tnhtimebilling:26202}  Portions of this report may have been transcribed using voice recognition software.  Every effort has been made to ensure accuracy; however, inadvertent computerized transcription errors may still be present.

## 2023-01-24 ENCOUNTER — Encounter: Payer: BC Managed Care – PPO | Admitting: Vascular Surgery

## 2023-01-24 ENCOUNTER — Ambulatory Visit (HOSPITAL_COMMUNITY): Payer: BC Managed Care – PPO

## 2023-02-09 IMAGING — CT CT ANGIO CHEST
2 of 8 series · 18 of 46 positions shown · IV contrast (Omnipaque or Isovue)
Comparison: None Available.

CLINICAL DATA: Rule out pulmonary embolism. Complains of weakness,
dizziness, headache, lateral neck throbbing, chest and bilateral jaw
all pressure.

EXAM:
CT ANGIOGRAPHY CHEST WITH CONTRAST
TECHNIQUE: Multidetector CT imaging of the chest was performed using the
standard protocol during bolus administration of intravenous
contrast. Multiplanar CT image reconstructions and MIPs were
obtained to evaluate the vascular anatomy.

[Series 5: pe axial thins · axial · 0.73mm/px · z∈[+1051,+1320]mm · 15 of 381 slices shown]
[im 22/381  lung]
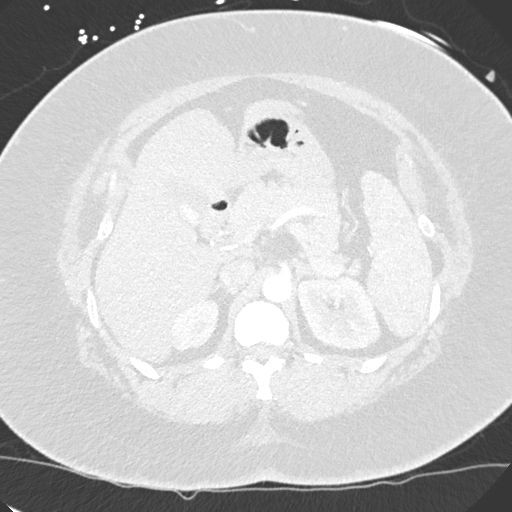
[im 43/381  soft-tissue]
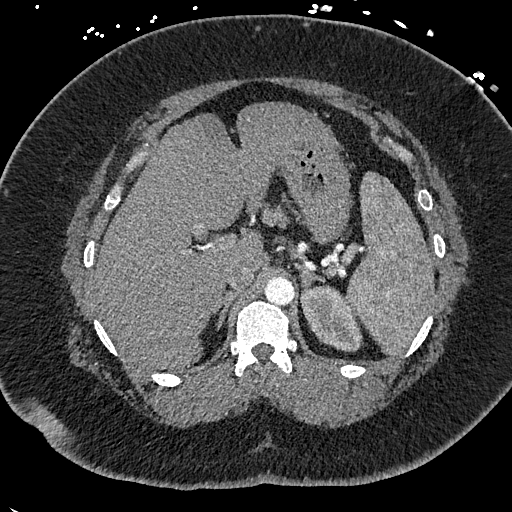
[im 64/381  lung]
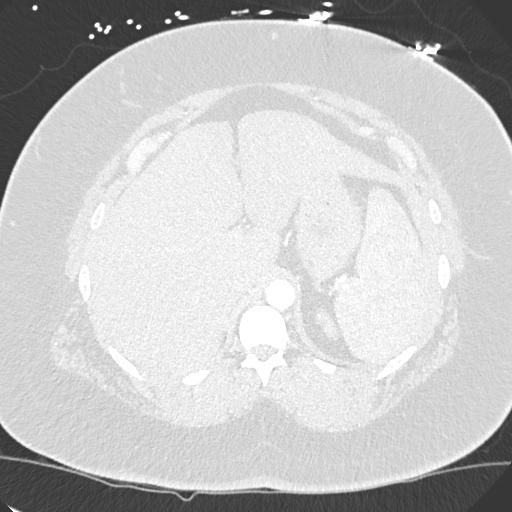
[im 85/381  soft-tissue]
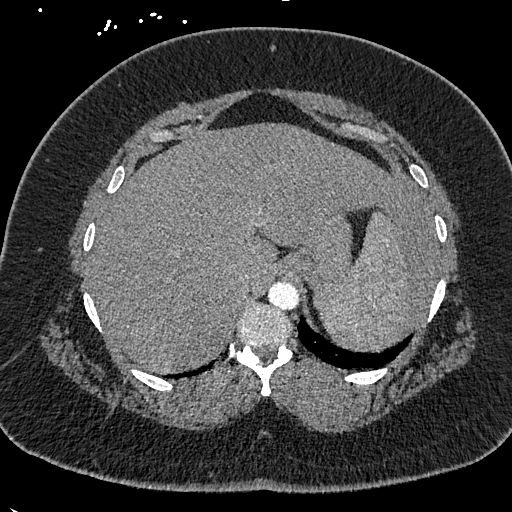
[im 127/381  lung]
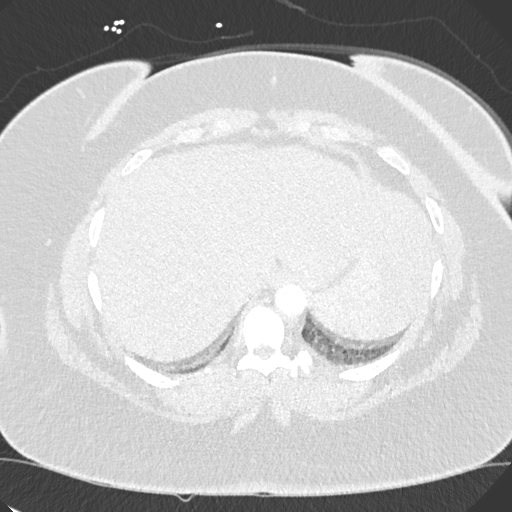
[im 148/381  soft-tissue]
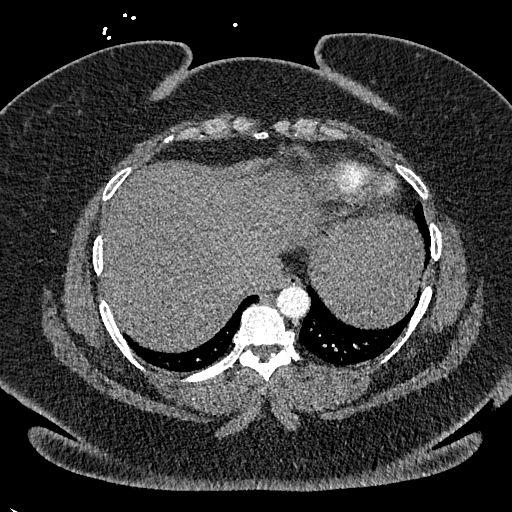
[im 169/381  lung]
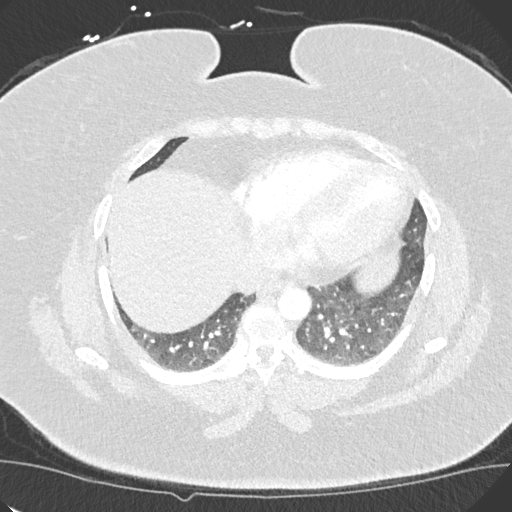
[im 191/381  soft-tissue]
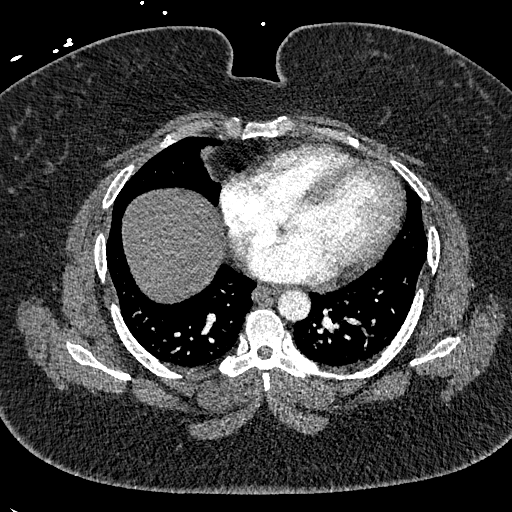
[im 212/381  lung]
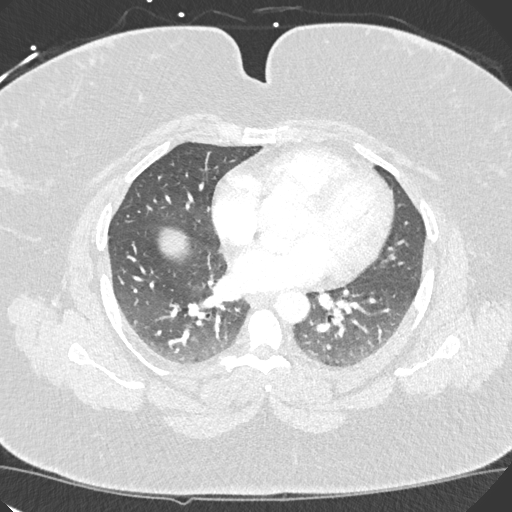
[im 233/381  soft-tissue]
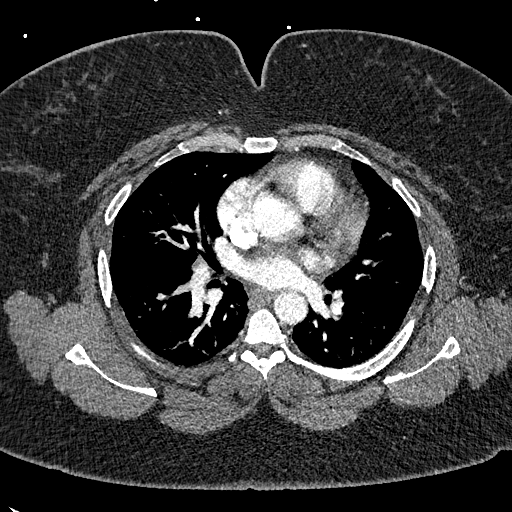
[im 254/381  lung]
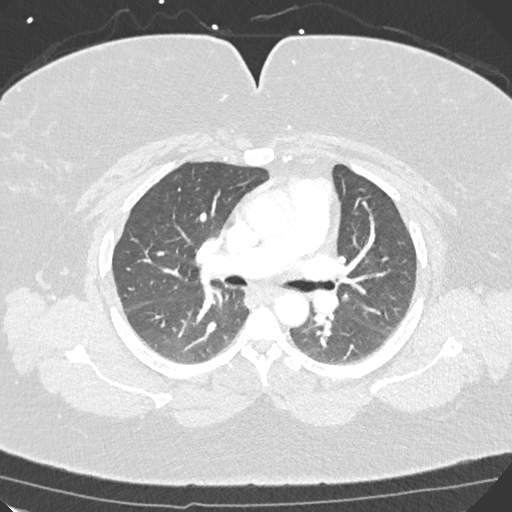
[im 296/381  soft-tissue]
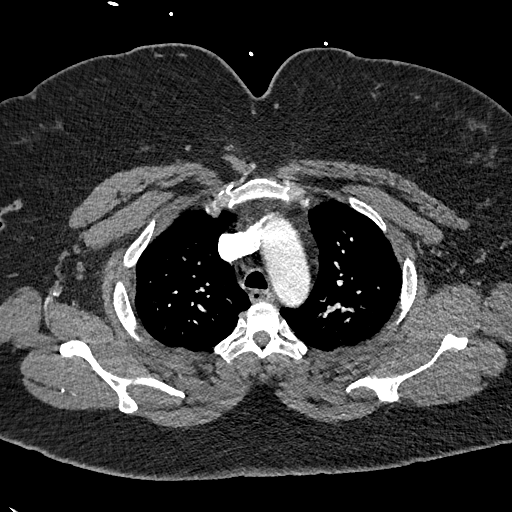
[im 317/381  lung]
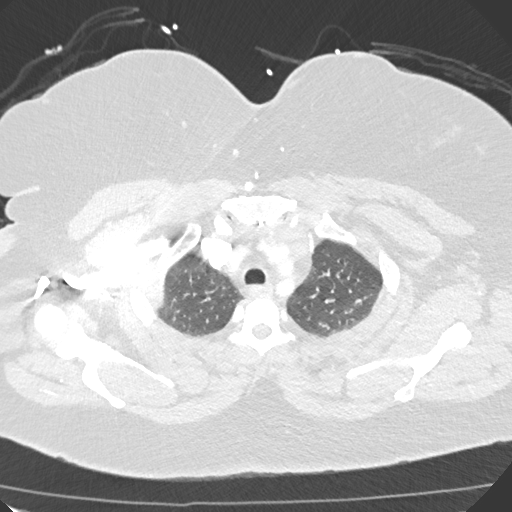
[im 338/381  soft-tissue]
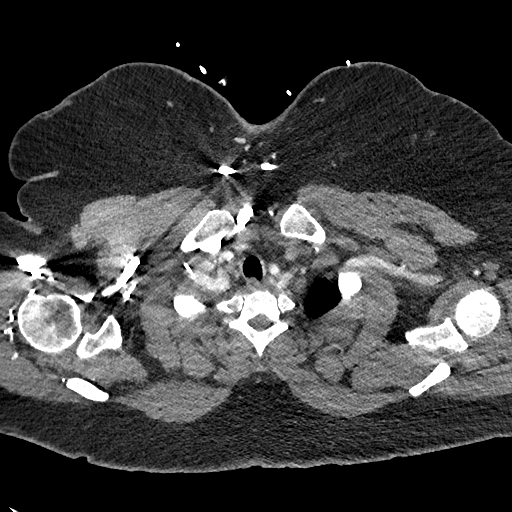
[im 359/381  lung]
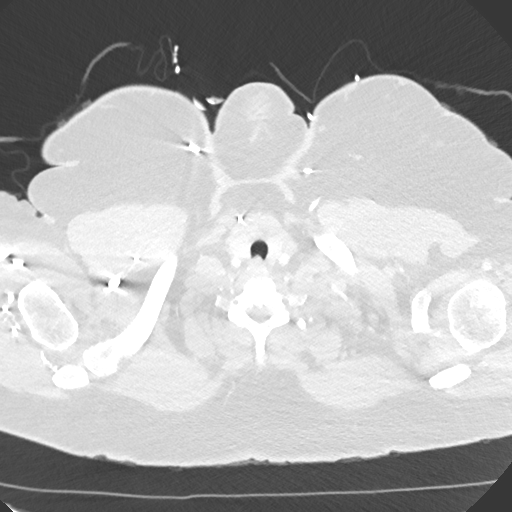

[Series 7: cor soft · coronal · 0.62mm/px · 3 of 179 slices shown]
[im 45/179  soft-tissue]
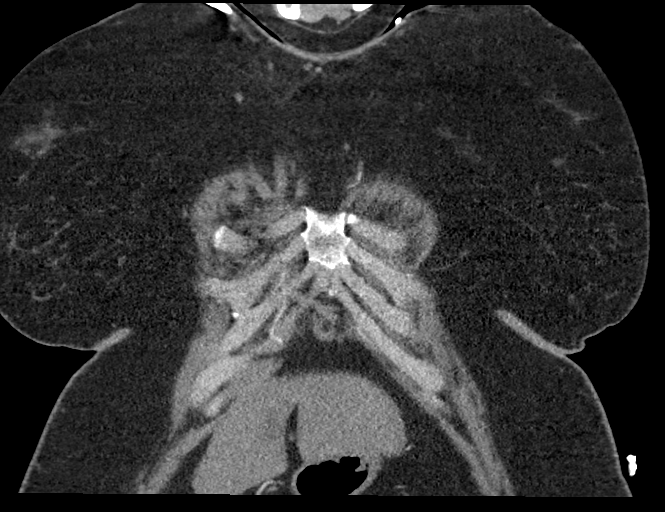
[im 90/179  soft-tissue]
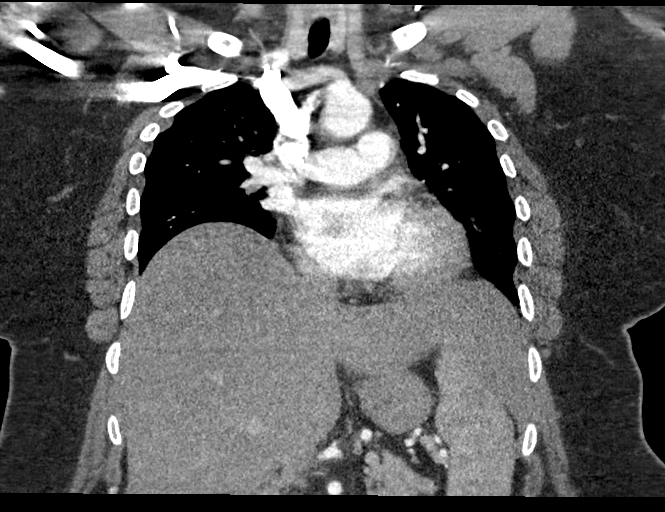
[im 134/179  soft-tissue]
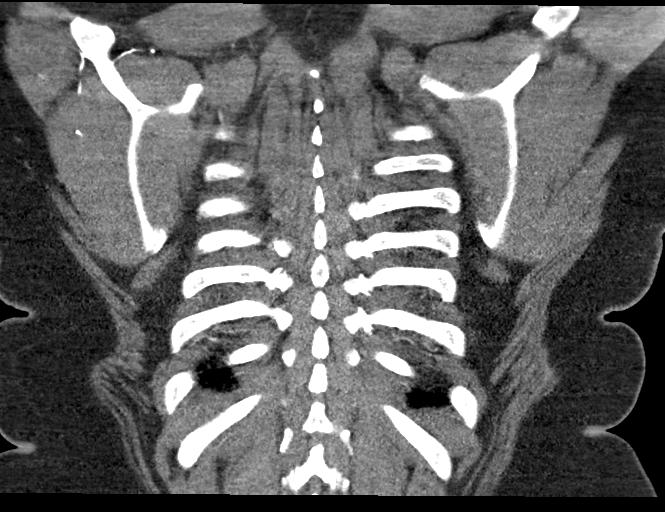

[18 of 46 positions shown; findings below may reference images not displayed]

RADIATION DOSE REDUCTION: This exam was performed according to the
departmental dose-optimization program which includes automated
exposure control, adjustment of the mA and/or kV according to
patient size and/or use of iterative reconstruction technique.

CONTRAST:  100mL OMNIPAQUE IOHEXOL 350 MG/ML SOLN
FINDINGS: Cardiovascular: Satisfactory opacification of the pulmonary arteries
to the segmental level. No evidence of pulmonary embolism.

Mild cardiac enlargement.  No pericardial effusion.

Mediastinum/Nodes: No enlarged mediastinal, hilar, or axillary lymph
nodes. Thyroid gland, trachea, and esophagus demonstrate no
significant findings.

Lungs/Pleura: No pleural effusion. No airspace consolidation,
atelectasis or pneumothorax.

Upper Abdomen: Subjective hepatic steatosis. There is hypertrophy of
the lateral segment of left hepatic lobe as well as caudate lobe of
liver. Gallstones are identified.

Musculoskeletal: No chest wall abnormality. No acute or significant
osseous findings.

Review of the MIP images confirms the above findings.
IMPRESSION: 1. No evidence for acute pulmonary embolism.
2. Subjective hepatic steatosis with hypertrophy of the lateral
segment of left lobe of liver and caudate lobe of liver. These
findings may be seen in the setting of early cirrhosis.
3. Gallstones.

## 2023-03-13 ENCOUNTER — Other Ambulatory Visit: Payer: Self-pay | Admitting: Family

## 2023-03-13 ENCOUNTER — Encounter: Payer: Self-pay | Admitting: Family

## 2023-03-13 ENCOUNTER — Ambulatory Visit: Payer: BC Managed Care – PPO | Admitting: Family

## 2023-03-13 DIAGNOSIS — R600 Localized edema: Secondary | ICD-10-CM

## 2023-03-13 DIAGNOSIS — J9801 Acute bronchospasm: Secondary | ICD-10-CM

## 2023-03-13 DIAGNOSIS — K760 Fatty (change of) liver, not elsewhere classified: Secondary | ICD-10-CM | POA: Diagnosis not present

## 2023-03-13 DIAGNOSIS — F32A Depression, unspecified: Secondary | ICD-10-CM

## 2023-03-13 DIAGNOSIS — I10 Essential (primary) hypertension: Secondary | ICD-10-CM

## 2023-03-13 DIAGNOSIS — R635 Abnormal weight gain: Secondary | ICD-10-CM

## 2023-03-13 DIAGNOSIS — F419 Anxiety disorder, unspecified: Secondary | ICD-10-CM

## 2023-03-13 MED ORDER — AIRSUPRA 90-80 MCG/ACT IN AERO
2.0000 | INHALATION_SPRAY | Freq: Three times a day (TID) | RESPIRATORY_TRACT | 5 refills | Status: DC
Start: 2023-03-13 — End: 2023-03-20

## 2023-03-13 MED ORDER — ESCITALOPRAM OXALATE 5 MG PO TABS: 5.0000 mg | ORAL_TABLET | Freq: Every day | ORAL | 1 refills | Status: AC

## 2023-03-13 MED ORDER — TIRZEPATIDE-WEIGHT MANAGEMENT 2.5 MG/0.5ML ~~LOC~~ SOLN
2.5000 mg | SUBCUTANEOUS | 1 refills | Status: DC
Start: 2023-03-13 — End: 2023-06-08

## 2023-03-13 NOTE — Progress Notes (Signed)
Patient ID: Emily Roy, female    DOB: 15-Aug-1975, 47 y.o.   MRN: [id number]  Chief Complaint  Patient presents with   Hypertension   Leg Swelling    Pt here for f/u on swelling in both legs, legs feels weak and heavy    Discussed the use of AI scribe software for clinical note transcription with the patient, who gave verbal consent to proceed.  History of Present Illness   Emily Roy, a patient with a recent history of lung infection and weight issues, presents with ongoing respiratory symptoms. She reports that her lungs often feel full and she experiences difficulty breathing, particularly when the weather changes. She has been using the AirSupra inhaler prn given to her last visit to manage these symptoms, which she reports as being helpful. In addition to her respiratory issues, Emily Roy is also dealing with weight management problems. She did an initial visit with [facility] weight loss clinic and had planned to start [facility] injections, but she can't afford high out of pocket cost.  She acknowledges the need to lose weight, particularly in light of potential vein surgery in her legs, and wants to discuss other options. She also takes escitalopram, but takes inconsistently for anxiety and depression, due to its side effect of causing sleepiness.     Hypertension/Leg swelling: Patient is currently maintained on the following medications for blood pressure: Amlodipine, Lasix, Toprol Failed meds include: Olmesartan, turned skin orange- brown on bilateral arms, then down legs, scalp tingling, stopped med & sx resolved. Patient reports good compliance with blood pressure medications. Patient denies chest pain,  shortness of breath.  Pt has moderate swelling in both legs with varicose veins & spider veins in the past. Pt works as Electrical engineer, mix of sitting, standing & walking. Pt states she has never tried compression socks. She is experiencing significant edema in the legs, despite taking  diuretics. The edema is so severe that it is causing cramping, which is only partially relieved by potassium supplementation. She had appts in October to see VVS and cardiology on same day but pt was called in to work and rescheduled appts for 12/19.  Assessment & Plan:    Chronic Bronchospasm -  History of severe lung infection, still experiencing fullness in lungs and occasional difficulty breathing, particularly with weather changes. Using an AirSupra for relief. Pt morbid obesity also a contributing factor. -Resending referral to Pulmonary. -Advised pt to call Pulmonary office to expedite appointment. -Sending refill prescription for AirSupra. -F/U if not seen by Pulmonary.  Weight Management - Struggling with weight management, impacting overall health and potentially complicating vascular issues. Interested in our weight loss management clinic. -Sending referral to weight loss management clinic. -Sending prescription for Zepbound 2.5mg  qweek for weight loss, pending insurance approval, pt advised on use & SE. -F/U 1 month if started on medication and not seen by [facility] clinic yet.  Depression/Anxiety - Inconsistent use of escitalopram due to side effects of sleepiness and dryness. Stressed the importance of taking medication daily. -Reduce escitalopram dose to 5mg  and advise taking at night. -Send prescription for 5mg  escitalopram. -F/U 6 mos or prn.  Bilateral LL Edema - Experiencing edema in legs, particularly after long periods of standing. Trying to manage with Lasix 20mg  qd, increasing to 40mg  prn with extra potassium.  -Advised on importance of attending her rescheduled appointments. -Continue current management strategy with Lasix and potassium. -F/U in 3 months unless under Vascular or Cardiology care.   Subjective:  Outpatient Medications Prior to Visit  Medication Sig Dispense Refill   Albuterol-Budesonide (AIRSUPRA) 90-80 MCG/ACT AERO Inhale 2 puffs into the lungs in  the morning, at noon, and at bedtime.     amLODipine (NORVASC) 10 MG tablet Take 1 tablet (10 mg total) by mouth daily. 90 tablet 1   escitalopram (LEXAPRO) 10 MG tablet Take 1 tablet (10 mg total) by mouth daily. 90 tablet 1   furosemide (LASIX) 20 MG tablet Take 1-2 tablets (20-40 mg total) by mouth daily. 90 tablet 1   metoprolol succinate (TOPROL-XL) 25 MG 24 hr tablet Take 1 tablet (25 mg total) by mouth at bedtime. 90 tablet 1   naproxen (NAPROSYN) 500 MG tablet Take 1 tablet (500 mg total) by mouth 2 (two) times daily with a meal. for pain.DO NOT TAKE WITH PREDNISONE. 60 tablet 0   potassium chloride SA (KLOR-CON M) 10 MEQ tablet Take 1 tablet (10 mEq total) by mouth daily. Take daily with Lasix, if you take 2 pills of Lasix, take 2 of these. 90 tablet 1   predniSONE (DELTASONE) 20 MG tablet Take 2 pills in the morning with breakfast for 3 days, then 1 pill for 2 days (Patient not taking: Reported on 03/13/2023) 8 tablet 0   No facility-administered medications prior to visit.   Past Medical History:  Diagnosis Date   Acute DVT (deep venous thrombosis) (HCC) 07/01/2013   Dysmenorrhea 09/29/2005   Formatting of this note might be different from the original. spotting   Fatigue 09/03/2007   Hypertension    Long term (current) use of anticoagulants 07/05/2013   Persistent headaches 01/10/2022   History reviewed. No pertinent surgical history. Allergies  Allergen Reactions   Olmesartan Other (See Comments)    skin turn orange, scalp tingling, worsened BP      Objective:    Physical Exam Vitals and nursing note reviewed.  Constitutional:      Appearance: Normal appearance. She is morbidly obese.  Cardiovascular:     Rate and Rhythm: Normal rate and regular rhythm.  Pulmonary:     Effort: Pulmonary effort is normal.     Breath sounds: Normal breath sounds.  Musculoskeletal:        General: Normal range of motion.  Skin:    General: Skin is warm and dry.  Neurological:      Mental Status: She is alert.  Psychiatric:        Mood and Affect: Mood normal.        Behavior: Behavior normal.   BP 135/85   Pulse 91   Temp (!) 97.3 F (36.3 C)   Ht 5\' 8"  (1.727 m)   Wt (!) 339 lb (153.8 kg)   SpO2 96%   BMI 51.54 kg/m  Wt Readings from Last 3 Encounters:  03/13/23 (!) 339 lb (153.8 kg)  01/13/23 (!) 345 lb 6 oz (156.7 kg)  12/27/22 (!) 330 lb 2 oz (149.7 kg)      Dulce Sellar, NP

## 2023-03-13 NOTE — Assessment & Plan Note (Signed)
chronic, Unstable BMI 51 Struggling with weight management, impacting overall health and potentially complicating vascular issues. Interested in our weight loss management clinic. -Sending referral to weight loss management clinic. -Sending prescription for Zepbound 2.5mg  qweek for weight loss, pending insurance approval, pt advised on use & SE. -F/U 1 month if started on medication and not seen by [facility] clinic yet.

## 2023-03-13 NOTE — Assessment & Plan Note (Signed)
chronic, Unstable Inconsistent use of escitalopram due to side effects of sleepiness and dryness. Stressed the importance of taking medication daily. -Reduce escitalopram dose to 5mg  and advise taking at night. -Send prescription for 5mg  escitalopram. -F/U 6 mos or prn.

## 2023-03-13 NOTE — Assessment & Plan Note (Signed)
chronic, Unstable Lymphedema -worse after long periods of standing. Had to reschedule VVS & cardio appts d/t work.  Trying to manage with Lasix 20mg  qd, increasing to 40mg  prn with extra potassium.  -Advised on importance of attending her rescheduled appointments. -Continue current management strategy with Lasix and potassium. -F/U in 3 months unless under Vascular or Cardiology care.

## 2023-03-17 ENCOUNTER — Telehealth: Payer: Self-pay

## 2023-03-17 ENCOUNTER — Other Ambulatory Visit (HOSPITAL_COMMUNITY): Payer: Self-pay

## 2023-03-17 DIAGNOSIS — J9801 Acute bronchospasm: Secondary | ICD-10-CM

## 2023-03-17 NOTE — Telephone Encounter (Signed)
Pharmacy Patient Advocate Encounter   Received notification from RX Request Messages that prior authorization for Airsupra 90-32mcg is required/requested.   Insurance verification completed.   The patient is insured through [facility] .   Per test claim: PA required; PA submitted to above mentioned insurance via CoverMyMeds Key/confirmation #/EOC [id number] Status is pending

## 2023-03-20 MED ORDER — ALBUTEROL SULFATE HFA 108 (90 BASE) MCG/ACT IN AERS
2.0000 | INHALATION_SPRAY | Freq: Four times a day (QID) | RESPIRATORY_TRACT | 2 refills | Status: AC | PRN
Start: 2023-03-20 — End: ?

## 2023-03-20 NOTE — Telephone Encounter (Signed)
let Mariyanna know the AirSupra inhaler was not covered, I have sent in an albuterol inhaler, use the same way, thx

## 2023-03-20 NOTE — Addendum Note (Signed)
Addended byDulce Sellar on: 03/20/2023 10:48 AM   Modules accepted: Orders

## 2023-03-20 NOTE — Telephone Encounter (Signed)
Pharmacy Patient Advocate Encounter  Received notification from [facility] that Prior Authorization for Airsupra 90-20mcg has been DENIED.  Full denial letter will be uploaded to the media tab. See denial reason below.   PA #/Case ID/Reference #: [id number]

## 2023-03-20 NOTE — Telephone Encounter (Signed)
I called pt and LVM in regards.  

## 2023-03-23 ENCOUNTER — Ambulatory Visit (HOSPITAL_COMMUNITY)
Admission: RE | Admit: 2023-03-23 | Discharge: 2023-03-23 | Disposition: A | Discharge status: Home / Self Care | Payer: BC Managed Care – PPO | Source: Ambulatory Visit | Attending: Vascular Surgery | Admitting: Vascular Surgery

## 2023-03-23 ENCOUNTER — Ambulatory Visit: Payer: BC Managed Care – PPO | Admitting: Physician Assistant

## 2023-03-23 VITALS — BP 171/93 | HR 91 | Temp 98.5°F | Resp 18 | Ht 68.0 in | Wt 340.0 lb

## 2023-03-23 DIAGNOSIS — R600 Localized edema: Secondary | ICD-10-CM | POA: Insufficient documentation

## 2023-03-23 DIAGNOSIS — I781 Nevus, non-neoplastic: Secondary | ICD-10-CM | POA: Diagnosis not present

## 2023-03-23 NOTE — Progress Notes (Signed)
Office Note     CC:  follow up Requesting Provider:  Dulce Sellar, NP  HPI: Emily Roy is a 47 y.o. (1976-02-17) female who presents for evaluation of bilateral lower extremity edema and spider veins.  She believes her legs are swelling more over the past several months and her spider veins have become more prominent.  She has a history of a DVT in her left leg diagnosed in 2015 in [place].  She also had a laser ablation of her left greater saphenous vein in [place] many years ago.  She has not had any significant trauma or any other vascular intervention of bilateral lower extremities.  Starting the new year her job will require her to be on her feet more.  She does not wear compression or elevate her leg during the day.  She unfortunately has been gaining weight.  She denies tobacco use.   Past Medical History:  Diagnosis Date   Acute DVT (deep venous thrombosis) (HCC) 07/01/2013   Dysmenorrhea 09/29/2005   Formatting of this note might be different from the original. spotting   Fatigue 09/03/2007   Hypertension    Long term (current) use of anticoagulants 07/05/2013   Peripheral vascular disease (HCC)    Persistent headaches 01/10/2022    Past Surgical History:  Procedure Laterality Date   Cesearean N/A 01/19/2000   delivery of twins   LASER ABLATION Bilateral 2007   bilateral lower extremity laser ablation done at an outside facility   WISDOM TOOTH EXTRACTION N/A 2001    Social History   Socioeconomic History   Marital status: Divorced    Spouse name: Not on file   Number of children: Not on file   Years of education: Not on file   Highest education level: Some college, no degree  Occupational History   Not on file  Tobacco Use   Smoking status: Never   Smokeless tobacco: Never  Vaping Use   Vaping status: Never Used  Substance and Sexual Activity   Alcohol use: No   Drug use: Not Currently   Sexual activity: Not Currently    Birth  control/protection: Surgical  Other Topics Concern   Not on file  Social History Narrative   Not on file   Social Drivers of Health   Financial Resource Strain: Medium Risk (09/19/2022)   Overall Financial Resource Strain (CARDIA)    Difficulty of Paying Living Expenses: Somewhat hard  Food Insecurity: Food Insecurity Present (09/19/2022)   Hunger Vital Sign    Worried About Running Out of Food in the Last Year: Sometimes true    Ran Out of Food in the Last Year: Sometimes true  Transportation Needs: Unmet Transportation Needs (09/19/2022)   PRAPARE - Transportation    Lack of Transportation (Medical): Yes    Lack of Transportation (Non-Medical): Yes  Physical Activity: Unknown (09/19/2022)   Exercise Vital Sign    Days of Exercise per Week: Patient declined    Minutes of Exercise per Session: Not on file  Stress: Stress Concern Present (09/19/2022)   [employer] of Occupational Health - Occupational Stress Questionnaire    Feeling of Stress : Rather much  Social Connections: Socially Isolated (09/19/2022)   Social Connection and Isolation Panel [NHANES]    Frequency of Communication with Friends and Family: Once a week    Frequency of Social Gatherings with Friends and Family: Never    Attends Religious Services: Never    Database administrator or Organizations: No    Attends  Banker Meetings: Not on file    Marital Status: Divorced  Intimate Partner Violence: Not on file   History reviewed. No pertinent family history.  Current Outpatient Medications  Medication Sig Dispense Refill   albuterol (VENTOLIN HFA) 108 (90 Base) MCG/ACT inhaler Inhale 2 puffs into the lungs every 6 (six) hours as needed for wheezing or shortness of breath. 8 g 2   amLODipine (NORVASC) 10 MG tablet Take 1 tablet (10 mg total) by mouth daily. 90 tablet 1   escitalopram (LEXAPRO) 5 MG tablet Take 1 tablet (5 mg total) by mouth daily. 90 tablet 1   furosemide (LASIX) 20 MG tablet Take  1-2 tablets (20-40 mg total) by mouth daily. 90 tablet 1   metoprolol succinate (TOPROL-XL) 25 MG 24 hr tablet Take 1 tablet (25 mg total) by mouth at bedtime. 90 tablet 1   naproxen (NAPROSYN) 500 MG tablet Take 1 tablet (500 mg total) by mouth 2 (two) times daily with a meal. for pain.DO NOT TAKE WITH PREDNISONE. 60 tablet 0   potassium chloride SA (KLOR-CON M) 10 MEQ tablet Take 1 tablet (10 mEq total) by mouth daily. Take daily with Lasix, if you take 2 pills of Lasix, take 2 of these. 90 tablet 1   tirzepatide (ZEPBOUND) 2.5 MG/0.5ML injection vial Inject 2.5 mg into the skin once a week. 2 mL 1   No current facility-administered medications for this visit.    Allergies  Allergen Reactions   Olmesartan Other (See Comments)    skin turn orange, scalp tingling, worsened BP     REVIEW OF SYSTEMS:   [X]  denotes positive finding, [ ]  denotes negative finding Cardiac  Comments:  Chest pain or chest pressure:    Shortness of breath upon exertion:    Short of breath when lying flat:    Irregular heart rhythm:        Vascular    Pain in calf, thigh, or hip brought on by ambulation:    Pain in feet at night that wakes you up from your sleep:     Blood clot in your veins:    Leg swelling:         Pulmonary    Oxygen at home:    Productive cough:     Wheezing:         Neurologic    Sudden weakness in arms or legs:     Sudden numbness in arms or legs:     Sudden onset of difficulty speaking or slurred speech:    Temporary loss of vision in one eye:     Problems with dizziness:         Gastrointestinal    Blood in stool:     Vomited blood:         Genitourinary    Burning when urinating:     Blood in urine:        Psychiatric    Major depression:         Hematologic    Bleeding problems:    Problems with blood clotting too easily:        Skin    Rashes or ulcers:        Constitutional    Fever or chills:      PHYSICAL EXAMINATION:  Vitals:   03/23/23 1228   BP: (!) 171/93  Pulse: 91  Resp: 18  Temp: 98.5 F (36.9 C)  TempSrc: Temporal  SpO2: 94%  Weight: (!) 340 lb (154.2  kg)  Height: 5\' 8"  (1.727 m)    General:  WDWN in NAD; vital signs documented above Gait: Not observed HENT: WNL, normocephalic Pulmonary: normal non-labored breathing , without Rales, rhonchi,  wheezing Cardiac: regular HR Abdomen: soft, NT, no masses Skin: without rashes Vascular Exam/Pulses: Palpable DP pulses Extremities: without ischemic changes, without Gangrene , without cellulitis; without open wounds; spider veins scattered both legs; no venous ulcers or hyperpigmentation changes Musculoskeletal: no muscle wasting or atrophy  Neurologic: A&O X 3 Psychiatric:  The pt has Normal affect.   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 female here for evaluation of bilateral lower extremity edema left worse than right as well as asymptomatic scattered spider veins  On exam patient has scattered spider veins of both lower extremities.  Left lower extremity venous reflux study was negative for DVT.  She has mild deep venous reflux.  Findings also consistent with prior greater saphenous vein ablation.  Recommendations included daily use of knee-high 15 to 20 mmHg compression socks.  She should also work on proper leg elevation especially at the end of the day for 10 to 15 minutes at a time.  She will try to avoid prolonged sitting and standing.  She can use NSAIDs when the discomfort associated with her legs and veins reaches its peak.  We briefly discussed sclerotherapy however she currently is not interested.  I provided her with Cherene Julian, RN card and she will give her a call if she changes her mind.  We also  discussed weight loss and how this will help venous return from her legs.  For now she will follow-up on an as-needed basis.   Emilie Rutter, PA-C [facility] [phone]  Clinic MD:   Myra Gianotti

## 2023-03-23 NOTE — Telephone Encounter (Signed)
Cancelling request due to medication being discontinued

## 2023-06-08 ENCOUNTER — Encounter: Payer: Self-pay | Admitting: Family

## 2023-06-08 ENCOUNTER — Ambulatory Visit: Payer: BC Managed Care – PPO | Admitting: Family

## 2023-06-08 ENCOUNTER — Institutional Professional Consult (permissible substitution) (HOSPITAL_BASED_OUTPATIENT_CLINIC_OR_DEPARTMENT_OTHER): Payer: BC Managed Care – PPO | Admitting: Pulmonary Disease

## 2023-06-08 VITALS — BP 140/96 | HR 84 | Temp 97.3°F | Ht 68.0 in | Wt 348.2 lb

## 2023-06-08 DIAGNOSIS — I83893 Varicose veins of bilateral lower extremities with other complications: Secondary | ICD-10-CM | POA: Diagnosis not present

## 2023-06-08 DIAGNOSIS — F419 Anxiety disorder, unspecified: Secondary | ICD-10-CM

## 2023-06-08 DIAGNOSIS — T502X5A Adverse effect of carbonic-anhydrase inhibitors, benzothiadiazides and other diuretics, initial encounter: Secondary | ICD-10-CM

## 2023-06-08 DIAGNOSIS — F32A Depression, unspecified: Secondary | ICD-10-CM

## 2023-06-08 DIAGNOSIS — I10 Essential (primary) hypertension: Secondary | ICD-10-CM

## 2023-06-08 DIAGNOSIS — R600 Localized edema: Secondary | ICD-10-CM | POA: Diagnosis not present

## 2023-06-08 DIAGNOSIS — E876 Hypokalemia: Secondary | ICD-10-CM

## 2023-06-08 LAB — COMPREHENSIVE METABOLIC PANEL
ALT: 18 U/L (ref 0–35)
AST: 16 U/L (ref 0–37)
Albumin: 4.1 g/dL (ref 3.5–5.2)
Alkaline Phosphatase: 60 U/L (ref 39–117)
BUN: 18 mg/dL (ref 6–23)
CO2: 27 meq/L (ref 19–32)
Calcium: 9.6 mg/dL (ref 8.4–10.5)
Chloride: 104 meq/L (ref 96–112)
Creatinine, Ser: 0.7 mg/dL (ref 0.40–1.20)
GFR: 101.86 mL/min (ref >60.00)
Glucose, Bld: 89 mg/dL (ref 70–99)
Potassium: 4 meq/L (ref 3.5–5.1)
Sodium: 138 meq/L (ref 135–145)
Total Bilirubin: 0.4 mg/dL (ref 0.2–1.2)
Total Protein: 7.2 g/dL (ref 6.0–8.3)

## 2023-06-08 MED ORDER — POTASSIUM CHLORIDE CRYS ER 10 MEQ PO TBCR
10.0000 meq | EXTENDED_RELEASE_TABLET | Freq: Every day | ORAL | 1 refills | Status: AC
  Filled 2024-03-25: qty 90, 90d supply, fill #0

## 2023-06-08 MED ORDER — FUROSEMIDE 20 MG PO TABS: 20.0000 mg | ORAL_TABLET | Freq: Every day | ORAL | 1 refills | Status: DC

## 2023-06-08 MED ORDER — METOPROLOL SUCCINATE ER 25 MG PO TB24
25.0000 mg | ORAL_TABLET | Freq: Every day | ORAL | 1 refills | Status: AC
Start: 2023-06-08 — End: ?
  Filled 2024-03-25: qty 90, 90d supply, fill #0

## 2023-06-08 MED ORDER — AMLODIPINE BESYLATE 10 MG PO TABS
5.0000 mg | ORAL_TABLET | Freq: Two times a day (BID) | ORAL | 1 refills | Status: AC
  Filled 2024-03-25: qty 90, 90d supply, fill #0

## 2023-06-08 MED ORDER — AMLODIPINE BESYLATE 10 MG PO TABS: 10.0000 mg | ORAL_TABLET | Freq: Every day | ORAL | 1 refills | Status: DC

## 2023-06-08 MED ORDER — FUROSEMIDE 20 MG PO TABS
40.0000 mg | ORAL_TABLET | Freq: Every morning | ORAL | 1 refills | Status: AC
  Filled 2024-03-25: qty 180, 90d supply, fill #0

## 2023-06-08 NOTE — Patient Instructions (Signed)
 It was very nice to see you today!  I will review your lab results via MyChart in a few days.  If labs ok, you can increase the Lasix to 60mg  (3 pills)  for 3 days at time to help swelling, then reduce back to 40mg  daily. I have sent over your refills. Start splitting the Amlodipine in half and take 1/2 pill in am and 1/2 at night - can be dinner time or bedtime. Switch Metoprolol to same time as 2nd dose of Amlodipine.  CALL [facility] to get fitted for the compression socks! Also ask if they can order the pneumatic (electric) leg wraps to use in the evenings.  Keep legs elevated when resting. Stay hydrated, have to drink at least 2 liters water daily due to taking diuretic every day.  Keep your Pulmonology appointment next week!  Follow up with me in 3 months.    PLEASE NOTE:  If you had any lab tests please let us know if you have not heard back within a few days. You may see your results on MyChart before we have a chance to review them but we will give you a call once they are reviewed by [place]. If we ordered any referrals today, please let us know if you have not heard from their office within the next week.

## 2023-06-08 NOTE — Assessment & Plan Note (Signed)
 Chronic lymphedema with significant fluid retention in legs. Lasix 40 mg daily ineffective. Compression stockings not used. Discussed electronic compression wraps. - Monitor potassium levels and renal function today. -Sending refills of Lasix and potassium today. - Encouraged to call [facility]ascular to get fitted for the compression stockings with instruction on how to apply - Discuss also ordering electronic compression wraps with vascular specialist. - Consider increasing Lasix to 60 mg x 3d if labs are acceptable. -Follow up in 3 months

## 2023-06-08 NOTE — Progress Notes (Signed)
 Patient ID: Emily Roy, female    DOB: 01-25-1976, 48 y.o.   MRN: [id number]  Chief Complaint  Patient presents with   Hypertension    States does not keep eye on bp at home. She states she is taking fluid pills at home furosemide 20mg  two of them each day not helping with the swelling in her legs. Concerned about weight gain due to the swelling of legs. Also has some chest congestion over two weeks.        Discussed the use of AI scribe software for clinical note transcription with the patient, who gave verbal consent to proceed.  History of Present Illness   The patient, with a history of varicose veins and high blood pressure, presents with persistent leg swelling despite daily use of Lasix. The swelling is significant, causing the legs to retain fluid and become 'squishy.' The patient denies the formation of blisters or weeping from the swollen areas. She has been recommended compression socks by a vascular specialist but has not yet picked them up. The patient also reports high blood pressure, managed with amlodipine and metoprolol. She has a scheduled appointment next week with a pulmonologist due to lung issues and is currently using an albuterol inhaler daily.     Assessment & Plan:     Lymphedema - Chronic lymphedema with significant fluid retention in legs. Lasix 40 mg daily ineffective. Compression stockings not used. Discussed electronic compression wraps. - Monitor potassium levels and renal function today. -Sending refills of Lasix and potassium today. - Encouraged to call [facility] to get fitted for the compression stockings with instruction on how to apply - Discuss also ordering electronic compression wraps with vascular specialist. - Consider increasing Lasix to 60 mg x 3d if labs are acceptable. -Follow up in 3 months  Hypertension - Blood pressure slightly elevated. Amlodipine may exacerbate edema in ankles. Adjusting medication timing may reduce swelling and  fatigue. - Split amlodipine dose; take half in morning, half at night - purchase pill splitter from pharmacy for accurate dosing. -Switch metoprolol to nighttime. -Refilling both meds today. -Follow up in 3 mos  Pulmonary Evaluation - Pending evaluation. Albuterol provides temporary relief. Previous Airsupra use was beneficial. - Ensure attendance at pulmonary evaluation.      Subjective:    Outpatient Medications Prior to Visit  Medication Sig Dispense Refill   albuterol (VENTOLIN HFA) 108 (90 Base) MCG/ACT inhaler Inhale 2 puffs into the lungs every 6 (six) hours as needed for wheezing or shortness of breath. 8 g 2   amLODipine (NORVASC) 10 MG tablet Take 1 tablet (10 mg total) by mouth daily. 90 tablet 1   escitalopram (LEXAPRO) 5 MG tablet Take 1 tablet (5 mg total) by mouth daily. 90 tablet 1   furosemide (LASIX) 20 MG tablet Take 1-2 tablets (20-40 mg total) by mouth daily. 90 tablet 1   metoprolol succinate (TOPROL-XL) 25 MG 24 hr tablet Take 1 tablet (25 mg total) by mouth at bedtime. 90 tablet 1   naproxen (NAPROSYN) 500 MG tablet Take 1 tablet (500 mg total) by mouth 2 (two) times daily with a meal. for pain.DO NOT TAKE WITH PREDNISONE. 60 tablet 0   potassium chloride SA (KLOR-CON M) 10 MEQ tablet Take 1 tablet (10 mEq total) by mouth daily. Take daily with Lasix, if you take 2 pills of Lasix, take 2 of these. 90 tablet 1   tirzepatide (ZEPBOUND) 2.5 MG/0.5ML injection vial Inject 2.5 mg into the skin once a week. (  Patient not taking: Reported on 06/08/2023) 2 mL 1   No facility-administered medications prior to visit.   Past Medical History:  Diagnosis Date   Acute DVT (deep venous thrombosis) (HCC) 07/01/2013   Dysmenorrhea 09/29/2005   Formatting of this note might be different from the original. spotting   Fatigue 09/03/2007   Hypertension    Long term (current) use of anticoagulants 07/05/2013   Peripheral vascular disease (HCC)    Persistent headaches 01/10/2022    Past Surgical History:  Procedure Laterality Date   Cesearean N/A 01/19/2000   delivery of twins   LASER ABLATION Bilateral 2007   bilateral lower extremity laser ablation done at an outside facility   WISDOM TOOTH EXTRACTION N/A 2001   Allergies  Allergen Reactions   Olmesartan Other (See Comments)    skin turn orange, scalp tingling, worsened BP      Objective:    Physical Exam Vitals and nursing note reviewed.  Constitutional:      Appearance: Normal appearance. She is morbidly obese.  Cardiovascular:     Rate and Rhythm: Normal rate and regular rhythm.     Comments: pt has several varicose veins on bilateral legs Pulmonary:     Effort: Pulmonary effort is normal.     Breath sounds: Normal breath sounds.  Musculoskeletal:        General: Normal range of motion.     Right lower leg: Edema present.     Left lower leg: Edema present.  Skin:    General: Skin is warm and dry.  Neurological:     Mental Status: She is alert.  Psychiatric:        Mood and Affect: Mood normal.        Behavior: Behavior normal.    BP (!) 140/96   Pulse 84   Temp (!) 97.3 F (36.3 C) (Temporal)   Ht 5\' 8"  (1.727 m)   Wt (!) 348 lb 3.2 oz (157.9 kg)   SpO2 98%   BMI 52.94 kg/m  Wt Readings from Last 3 Encounters:  06/08/23 (!) 348 lb 3.2 oz (157.9 kg)  03/23/23 (!) 340 lb (154.2 kg)  03/13/23 (!) 339 lb (153.8 kg)       Dulce Sellar, NP

## 2023-06-08 NOTE — Assessment & Plan Note (Signed)
 Blood pressure slightly elevated. Amlodipine may exacerbate edema in ankles. Adjusting medication timing may reduce swelling and fatigue. - Split amlodipine dose; take half in morning, half at night - purchase pill splitter from pharmacy for accurate dosing. -Switch metoprolol to nighttime. -Refilling both meds today. -Follow up in 3 mos

## 2023-06-12 ENCOUNTER — Ambulatory Visit: Payer: BC Managed Care – PPO | Admitting: Family

## 2023-06-15 ENCOUNTER — Institutional Professional Consult (permissible substitution): Admitting: Internal Medicine

## 2023-07-25 ENCOUNTER — Ambulatory Visit: Admitting: Internal Medicine

## 2023-09-08 ENCOUNTER — Ambulatory Visit: Admitting: Family

## 2023-10-20 ENCOUNTER — Ambulatory Visit: Admitting: Family

## 2024-03-25 ENCOUNTER — Other Ambulatory Visit: Payer: Self-pay

## 2024-03-25 ENCOUNTER — Other Ambulatory Visit (HOSPITAL_BASED_OUTPATIENT_CLINIC_OR_DEPARTMENT_OTHER): Payer: Self-pay

## 2024-03-25 MED ORDER — FUROSEMIDE 20 MG PO TABS: 20.0000 mg | ORAL_TABLET | Freq: Every day | ORAL | 1 refills | Status: AC

## 2024-04-05 ENCOUNTER — Other Ambulatory Visit (HOSPITAL_BASED_OUTPATIENT_CLINIC_OR_DEPARTMENT_OTHER): Payer: Self-pay
